# Patient Record
Sex: Female | Born: 1990 | Race: White | Hispanic: No | Marital: Single | Smoking: Former smoker
Health system: Southern US, Community
[De-identification: ages and names within clinical notes are randomized; demographics above are authoritative.]

## PROBLEM LIST (undated history)

## (undated) DIAGNOSIS — N390 Urinary tract infection, site not specified: Secondary | ICD-10-CM

## (undated) HISTORY — PX: NO PAST SURGERIES: SHX2092

---
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
## 2010-12-03 NOTE — L&D Delivery Note (Signed)
Delivery Note At  a viable female was delivered via NSVD (Presentation: LOA ;  ).  APGAR: 9, 9; weight 6lb 14oz.   Placenta status: intact, spontaneous.  Cord: 3 vessel with the following complications: none.    Anesthesia:  IV stadol as needed Episiotomy: none Lacerations: R periurethral Suture Repair: 3.0 vicryl Est. Blood Loss (mL):  Mom to postpartum.  Baby to nursery-stable.  BOOTH, Sadao Weyer 08/07/2011, 7:38 PM

## 2011-04-30 ENCOUNTER — Emergency Department (HOSPITAL_COMMUNITY)
Admission: EM | Admit: 2011-04-30 | Discharge: 2011-04-30 | Disposition: A | Discharge status: Home / Self Care | Payer: Medicaid Other | Attending: Emergency Medicine | Admitting: Emergency Medicine

## 2011-04-30 DIAGNOSIS — O2390 Unspecified genitourinary tract infection in pregnancy, unspecified trimester: Secondary | ICD-10-CM | POA: Insufficient documentation

## 2011-04-30 DIAGNOSIS — N390 Urinary tract infection, site not specified: Secondary | ICD-10-CM | POA: Insufficient documentation

## 2011-04-30 LAB — GLUCOSE, CAPILLARY: Glucose-Capillary: 96 mg/dL (ref 70–99)

## 2011-04-30 LAB — URINALYSIS, ROUTINE W REFLEX MICROSCOPIC
Glucose, UA: 100 mg/dL — AB
Nitrite: POSITIVE — AB
Specific Gravity, Urine: 1.025 (ref 1.005–1.030)

## 2011-04-30 LAB — URINE MICROSCOPIC-ADD ON

## 2011-06-28 LAB — STREP B DNA PROBE: GBS: NEGATIVE

## 2011-06-28 LAB — HEPATITIS B SURFACE ANTIGEN: Hepatitis B Surface Ag: NEGATIVE

## 2011-06-28 LAB — RUBELLA ANTIBODY, IGM: Rubella: IMMUNE

## 2011-06-28 LAB — ABO/RH: RH Type: POSITIVE

## 2011-08-07 ENCOUNTER — Encounter (HOSPITAL_COMMUNITY): Payer: Self-pay

## 2011-08-07 ENCOUNTER — Encounter (HOSPITAL_COMMUNITY): Payer: Self-pay | Admitting: *Deleted

## 2011-08-07 ENCOUNTER — Inpatient Hospital Stay (HOSPITAL_COMMUNITY)
Admission: AD | Admit: 2011-08-07 | Discharge: 2011-08-09 | DRG: 775 | Disposition: A | Discharge status: Home / Self Care | Payer: Medicaid Other | Source: Ambulatory Visit | Attending: Obstetrics & Gynecology | Admitting: Obstetrics & Gynecology

## 2011-08-07 ENCOUNTER — Inpatient Hospital Stay (HOSPITAL_COMMUNITY)
Admission: AD | Admit: 2011-08-07 | Discharge: 2011-08-07 | Disposition: A | Discharge status: Home / Self Care | Payer: Medicaid Other | Source: Ambulatory Visit | Attending: Obstetrics & Gynecology | Admitting: Obstetrics & Gynecology

## 2011-08-07 DIAGNOSIS — O479 False labor, unspecified: Secondary | ICD-10-CM | POA: Insufficient documentation

## 2011-08-07 DIAGNOSIS — IMO0001 Reserved for inherently not codable concepts without codable children: Secondary | ICD-10-CM

## 2011-08-07 LAB — CBC
HCT: 34.4 % — ABNORMAL LOW (ref 36.0–46.0)
Hemoglobin: 11.5 g/dL — ABNORMAL LOW (ref 12.0–15.0)
MCH: 28.3 pg (ref 26.0–34.0)
MCV: 84.5 fL (ref 78.0–100.0)
RBC: 4.07 MIL/uL (ref 3.87–5.11)

## 2011-08-07 LAB — RPR: RPR Ser Ql: NONREACTIVE

## 2011-08-07 MED ORDER — ONDANSETRON HCL 4 MG/2ML IJ SOLN: 4.0000 mg | INTRAMUSCULAR | Status: DC | PRN

## 2011-08-07 MED ORDER — ZOLPIDEM TARTRATE 5 MG PO TABS: 5.0000 mg | ORAL_TABLET | Freq: Every evening | ORAL | Status: DC | PRN

## 2011-08-07 MED ORDER — OXYTOCIN 20 UNITS IN LACTATED RINGERS INFUSION - SIMPLE
125.0000 mL/h | Freq: Once | INTRAVENOUS | Status: AC
  Administered 2011-08-07: 125 mL/h via INTRAVENOUS

## 2011-08-07 MED ORDER — DIBUCAINE 1 % RE OINT: 1.0000 "application " | TOPICAL_OINTMENT | RECTAL | Status: DC | PRN

## 2011-08-07 MED ORDER — FLEET ENEMA 7-19 GM/118ML RE ENEM: 1.0000 | ENEMA | RECTAL | Status: DC | PRN

## 2011-08-07 MED ORDER — TETANUS-DIPHTH-ACELL PERTUSSIS 5-2.5-18.5 LF-MCG/0.5 IM SUSP
0.5000 mL | Freq: Once | INTRAMUSCULAR | Status: AC
  Administered 2011-08-08: 0.5 mL via INTRAMUSCULAR
  Filled 2011-08-07: qty 0.5

## 2011-08-07 MED ORDER — BUTORPHANOL TARTRATE 2 MG/ML IJ SOLN
1.0000 mg | INTRAMUSCULAR | Status: DC | PRN
  Administered 2011-08-07 (×3): 1 mg via INTRAVENOUS
  Filled 2011-08-07 (×2): qty 1

## 2011-08-07 MED ORDER — OXYCODONE-ACETAMINOPHEN 5-325 MG PO TABS
1.0000 | ORAL_TABLET | ORAL | Status: DC | PRN
  Administered 2011-08-08: 2 via ORAL
  Administered 2011-08-08: 1 via ORAL
  Administered 2011-08-09: 2 via ORAL
  Filled 2011-08-07: qty 1

## 2011-08-07 MED ORDER — LACTATED RINGERS IV SOLN: 500.0000 mL | INTRAVENOUS | Status: DC | PRN

## 2011-08-07 MED ORDER — ONDANSETRON HCL 4 MG PO TABS: 4.0000 mg | ORAL_TABLET | ORAL | Status: DC | PRN

## 2011-08-07 MED ORDER — PRENATAL PLUS 27-1 MG PO TABS
1.0000 | ORAL_TABLET | Freq: Every day | ORAL | Status: DC
  Administered 2011-08-08 – 2011-08-09 (×2): 1 via ORAL
  Filled 2011-08-07 (×2): qty 1

## 2011-08-07 MED ORDER — CITRIC ACID-SODIUM CITRATE 334-500 MG/5ML PO SOLN: 30.0000 mL | ORAL | Status: DC | PRN

## 2011-08-07 MED ORDER — LIDOCAINE HCL (PF) 1 % IJ SOLN
30.0000 mL | INTRAMUSCULAR | Status: DC | PRN
  Filled 2011-08-07 (×2): qty 30

## 2011-08-07 MED ORDER — OXYTOCIN BOLUS FROM INFUSION
500.0000 mL | Freq: Once | INTRAVENOUS | Status: DC
  Filled 2011-08-07: qty 500
  Filled 2011-08-07: qty 1000

## 2011-08-07 MED ORDER — DIPHENHYDRAMINE HCL 25 MG PO CAPS
25.0000 mg | ORAL_CAPSULE | Freq: Four times a day (QID) | ORAL | Status: DC | PRN
Start: 2011-08-07 — End: 2011-08-09

## 2011-08-07 MED ORDER — IBUPROFEN 600 MG PO TABS
600.0000 mg | ORAL_TABLET | Freq: Four times a day (QID) | ORAL | Status: DC | PRN
  Filled 2011-08-07 (×5): qty 1

## 2011-08-07 MED ORDER — BENZOCAINE-MENTHOL 20-0.5 % EX AERO
1.0000 "application " | INHALATION_SPRAY | CUTANEOUS | Status: DC | PRN
  Administered 2011-08-08: 1 via TOPICAL

## 2011-08-07 MED ORDER — WITCH HAZEL-GLYCERIN EX PADS: 1.0000 "application " | MEDICATED_PAD | CUTANEOUS | Status: DC | PRN

## 2011-08-07 MED ORDER — ACETAMINOPHEN 325 MG PO TABS: 650.0000 mg | ORAL_TABLET | ORAL | Status: DC | PRN

## 2011-08-07 MED ORDER — BUTORPHANOL TARTRATE 2 MG/ML IJ SOLN
INTRAMUSCULAR | Status: AC
  Administered 2011-08-07: 1 mg via INTRAVENOUS
  Filled 2011-08-07: qty 1

## 2011-08-07 MED ORDER — LANOLIN HYDROUS EX OINT: TOPICAL_OINTMENT | CUTANEOUS | Status: DC | PRN

## 2011-08-07 MED ORDER — SENNOSIDES-DOCUSATE SODIUM 8.6-50 MG PO TABS
2.0000 | ORAL_TABLET | Freq: Every day | ORAL | Status: DC
  Administered 2011-08-08 (×2): 2 via ORAL

## 2011-08-07 MED ORDER — LACTATED RINGERS IV SOLN
INTRAVENOUS | Status: DC
  Administered 2011-08-07: 14:00:00 via INTRAVENOUS

## 2011-08-07 MED ORDER — OXYCODONE-ACETAMINOPHEN 5-325 MG PO TABS
2.0000 | ORAL_TABLET | ORAL | Status: DC | PRN
  Filled 2011-08-07 (×2): qty 1
  Filled 2011-08-07: qty 2

## 2011-08-07 MED ORDER — SIMETHICONE 80 MG PO CHEW: 80.0000 mg | CHEWABLE_TABLET | ORAL | Status: DC | PRN

## 2011-08-07 MED ORDER — ONDANSETRON HCL 4 MG/2ML IJ SOLN: 4.0000 mg | Freq: Four times a day (QID) | INTRAMUSCULAR | Status: DC | PRN

## 2011-08-07 MED ORDER — IBUPROFEN 600 MG PO TABS
600.0000 mg | ORAL_TABLET | Freq: Four times a day (QID) | ORAL | Status: DC
  Administered 2011-08-08 – 2011-08-09 (×6): 600 mg via ORAL
  Filled 2011-08-07: qty 1

## 2011-08-07 NOTE — ED Notes (Signed)
Discussed D/C instrs. at length with husb. and family members present. Acknowledged patients discomfort, irreg. contractions and UI. Discussed significance of cervical change. Emotional support to patient. Discussed comfort measures. Advised drinking fluids and eating small amounts. Advised that she take a warm shower or bath and try to rest. Instructed patient to call [facility] later if she continues to be uncomfortable and they may examine her in the office. Acknowledged family concerns. They want patient admitted now despite no cervical change. Explained to family the process of labor, that patient and baby are doing well and that there is no reason to interfere at this time. Family verbalizes understanding. EFM D/C'd.

## 2011-08-07 NOTE — Progress Notes (Signed)
  Mindy [name] is a 20 y.o. G1P0 at [redacted]w[redacted]d admitted for active labor.  Seen at 1630.  Subjective: Doing well with stadol for pain control.  Does not want epidural at this time.  Objective: BP 123/88  Pulse 63  Temp(Src) 98.4 F (36.9 C) (Oral)  Resp 20  Ht 5\' 5"  (1.651 m)  Wt 159 lb (72.122 kg)  BMI 26.46 kg/m2      FHT:  FHR: 110 bpm, variability: moderate,  accelerations:  Abscent,  decelerations:  Absent UC:   regular, every 2 minutes SVE:   Dilation: 7.5 Effacement (%): 90 Station: 0 Exam by:: Boothe  Labs: Lab Results  Component Value Date   WBC 20.8* 08/07/2011   HGB 11.5* 08/07/2011   HCT 34.4* 08/07/2011   MCV 84.5 08/07/2011   PLT 294 08/07/2011    Assessment / Plan: Spontaneous labor, progressing normally  Labor: Progressing normally Fetal Wellbeing:  Category I Pain Control:  stadol as needed I/D:  n/a Anticipated MOD:  NSVD  BOOTH, Kenlyn Lose 08/07/2011, 5:20 PM

## 2011-08-07 NOTE — H&P (Signed)
Mindy [name] is a 20 y.o. female presenting for contractions. Was seen earlier AM in MAU was 1 cm; was seen at [facility] for routine appt and had membranes stripped around 10 AM and contractions became stronger. Some bloody show, good fetal movement. Maternal Medical History:  Reason for admission: Reason for admission: contractions and vaginal bleeding.  Contractions: Onset was 3-5 hours ago.   Frequency: regular.   Duration is approximately 90 seconds.   Perceived severity is moderate.    Fetal activity: Perceived fetal activity is normal.   Last perceived fetal movement was within the past hour.    Prenatal complications: No bleeding.   Prenatal Complications - Diabetes: none.    OB History    Grav Para Term Preterm Abortions TAB SAB Ect Mult Living   1              Past Medical History  Diagnosis Date  . No pertinent past medical history    Past Surgical History  Procedure Date  . No past surgeries    Family History: family history is not on file. Social History:  reports that she has quit smoking. She does not have any smokeless tobacco history on file. She reports that she does not drink alcohol or use illicit drugs.  Review of Systems  Constitutional: Negative.   HENT: Negative.   Respiratory: Negative.   Cardiovascular: Negative.   Gastrointestinal: Negative.   Genitourinary: Negative.   Musculoskeletal: Negative.   Skin: Negative.   Neurological: Negative.   Endo/Heme/Allergies: Negative.   Psychiatric/Behavioral: Negative.     Dilation: 3 Effacement (%): 80 Station: -1 Exam by:: J BURNETTE RN  Blood pressure 130/84, pulse 76, temperature 98.6 F (37 C), temperature source Oral, resp. rate 20, height 5\' 5"  (1.651 m), weight 72.122 kg (159 lb). Maternal Exam:  Uterine Assessment: Contraction strength is moderate.  Contraction frequency is regular.   Abdomen: Estimated fetal weight is 7.5 - 8 pounds by leopolds.   Fetal presentation:  vertex  Introitus: Ferning test: not done.  Nitrazine test: not done.  Cervix: Cervix evaluated by digital exam.     Physical Exam  Vitals reviewed. Constitutional: She is oriented to person, place, and time. She appears well-developed and well-nourished. She appears distressed.  HENT:  Head: Normocephalic.  Eyes: Pupils are equal, round, and reactive to light.  Neck: Normal range of motion.  Cardiovascular: Normal rate, regular rhythm, normal heart sounds and intact distal pulses.  Exam reveals no gallop and no friction rub.   No murmur heard. Respiratory: Effort normal and breath sounds normal. No respiratory distress. She has no wheezes. She has no rales. She exhibits no tenderness.  GI: Soft. Bowel sounds are normal. There is no tenderness.  Musculoskeletal: Normal range of motion.  Neurological: She is alert and oriented to person, place, and time. She has normal reflexes. She displays normal reflexes. No cranial nerve deficit. She exhibits normal muscle tone. Coordination normal.  Skin: Skin is warm and dry. No rash noted. She is not diaphoretic.  Psychiatric: She has a normal mood and affect.  Dilation: 3 Effacement (%): 80 Station: -1 Presentation: Vertex Exam by:: J BURNETTE RN  FHT: 112-130's/+accels/no decels/contractions q2-3 min, Category 1    Prenatal labs: ABO, Rh:  A+ Antibody:  neg Rubella:  immune RPR:   NR HBsAg:   Neg HIV:   Neg GBS:   Neg 2 hr Glu: 79-112-91  Assessment/Plan: Active labor. Will admit for labor and delivery. Plans to breastfeed  and desires Implenon.  Seneca Gadbois N 08/07/2011, 1:51 PM

## 2011-08-07 NOTE — Progress Notes (Signed)
Contractions q 5 minutes, pinkish discharge on tissue, last SVE last week, 1cm/70

## 2011-08-08 MED ORDER — BENZOCAINE-MENTHOL 20-0.5 % EX AERO
INHALATION_SPRAY | CUTANEOUS | Status: AC
  Administered 2011-08-08: 1 via TOPICAL
  Filled 2011-08-08: qty 56

## 2011-08-08 NOTE — Progress Notes (Signed)
UR chart review completed.  

## 2011-08-08 NOTE — Progress Notes (Signed)
  Subjective:    Patient ID: Mindy [name], female    DOB: [birth year], 20 y.o.   MRN: [id number]  HPI Post Partum Day 1 Subjective: up ad lib, voiding, tolerating PO and moderate pain at bottom. Breast feeding is going well.   Objective: Blood pressure 102/55, pulse 77, temperature 99 F (37.2 C), temperature source Oral, resp. rate 20, height 5\' 5"  (1.651 m), weight 159 lb (72.122 kg), unknown if currently breastfeeding.  Physical Exam:  General: alert, cooperative and no distress Lochia: a bit more than a period. Uterine Fundus: firm Incision: N/A DVT Evaluation: No evidence of DVT seen on physical exam. No significant calf/ankle edema.   Basename 08/07/11 1420  HGB 11.5*  HCT 34.4*    Assessment/Plan: Plan for discharge tomorrow, Breastfeeding and Contraception Nexplanon at 6 week f/u.    LOS: 1 day   Mindy [name] 08/08/2011, 7:42 AM      Review of Systems     Objective:   Physical Exam        Assessment & Plan:

## 2011-08-09 MED ORDER — IBUPROFEN 600 MG PO TABS: 600.0000 mg | ORAL_TABLET | Freq: Four times a day (QID) | ORAL | Status: AC | PRN

## 2011-08-09 MED ORDER — PRENATAL PLUS 27-1 MG PO TABS: 1.0000 | ORAL_TABLET | Freq: Every day | ORAL | Status: DC

## 2011-08-09 MED ORDER — OXYCODONE-ACETAMINOPHEN 5-325 MG PO TABS: 1.0000 | ORAL_TABLET | ORAL | Status: AC | PRN

## 2011-08-09 NOTE — Discharge Summary (Signed)
Obstetric Discharge Summary Reason for Admission: onset of labor Prenatal Procedures: ultrasound Intrapartum Procedures: spontaneous vaginal delivery Postpartum Procedures: none Complications-Operative and Postpartum: periurethral laceration   H/H: Lab Results  Component Value Date/Time   HGB 11.5* 08/07/2011  2:20 PM   HCT 34.4* 08/07/2011  2:20 PM      Discharge Diagnoses: Term Pregnancy-delivered  Discharge Information: Date: 06/14/2011 Activity: unrestricted and pelvic rest Diet: routine Medications: PNV, Ibuprophen and Percocet Breast feeding:  Yes Condition: stable Instructions: refer to practice specific booklet Discharge to: home   [name], Mindy Grandmaison 08/09/2011,7:23 AM

## 2011-08-09 NOTE — Progress Notes (Signed)
  Post Partum Day 2 Subjective: no complaints, up ad lib, voiding, tolerating PO, + flatus and pain in her bottom., thin lochia, present BM, present flatus, plans to breastfeed, implanon  Objective: Blood pressure 95/61, pulse 73, temperature 98.1 F (36.7 C), temperature source Oral, resp. rate 18, height 5\' 5"  (1.651 m), weight 159 lb (72.122 kg), unknown if currently breastfeeding.  Physical Exam:  General: alert, cooperative, appears stated age and no distress Lochia: appropriate Chest: CTAB Heart: RRR no m/r/g Abdomen: +BS, soft, nontender,  Uterine Fundus: firm DVT Evaluation: No evidence of DVT seen on physical exam. Negative Homan's sign. No significant calf/ankle edema. Extremities:    Basename 08/07/11 1420  HGB 11.5*  HCT 34.4*    Assessment/Plan: Discharge home, Breastfeeding and Contraception implanon   LOS: 2 days   BOOTH, Bobak Oguinn 08/09/2011, 7:20 AM

## 2011-08-09 NOTE — Discharge Summary (Signed)
Agree with above note.  April Carlyon H. 08/09/2011 11:17 AM

## 2011-08-14 ENCOUNTER — Ambulatory Visit (HOSPITAL_COMMUNITY): Admit: 2011-08-14 | Payer: Medicaid Other

## 2011-09-06 LAB — URINALYSIS, ROUTINE W REFLEX MICROSCOPIC
Ketones, ur: NEGATIVE mg/dL
Nitrite: NEGATIVE
Protein, ur: NEGATIVE mg/dL
Urobilinogen, UA: 0.2 mg/dL (ref 0.0–1.0)
pH: 8 (ref 5.0–8.0)

## 2011-09-06 LAB — URINE CULTURE

## 2011-09-06 LAB — PREGNANCY, URINE: Preg Test, Ur: NEGATIVE

## 2012-01-17 ENCOUNTER — Emergency Department (HOSPITAL_COMMUNITY): Payer: Self-pay

## 2012-01-17 ENCOUNTER — Emergency Department (HOSPITAL_COMMUNITY)
Admission: EM | Admit: 2012-01-17 | Discharge: 2012-01-17 | Disposition: A | Discharge status: Home / Self Care | Payer: Self-pay | Attending: Emergency Medicine | Admitting: Emergency Medicine

## 2012-01-17 ENCOUNTER — Encounter (HOSPITAL_COMMUNITY): Payer: Self-pay | Admitting: Emergency Medicine

## 2012-01-17 DIAGNOSIS — R05 Cough: Secondary | ICD-10-CM

## 2012-01-17 DIAGNOSIS — J029 Acute pharyngitis, unspecified: Secondary | ICD-10-CM | POA: Insufficient documentation

## 2012-01-17 DIAGNOSIS — R059 Cough, unspecified: Secondary | ICD-10-CM | POA: Insufficient documentation

## 2012-01-17 DIAGNOSIS — J45909 Unspecified asthma, uncomplicated: Secondary | ICD-10-CM | POA: Insufficient documentation

## 2012-01-17 LAB — RAPID STREP SCREEN (MED CTR MEBANE ONLY): Streptococcus, Group A Screen (Direct): NEGATIVE

## 2012-01-17 MED ORDER — HYDROCOD POLST-CHLORPHEN POLST 10-8 MG/5ML PO LQCR
5.0000 mL | Freq: Once | ORAL | Status: AC
  Administered 2012-01-17: 5 mL via ORAL
  Filled 2012-01-17: qty 5

## 2012-01-17 MED ORDER — GUAIFENESIN-CODEINE 100-10 MG/5ML PO SYRP: ORAL_SOLUTION | ORAL | Status: DC

## 2012-01-17 NOTE — ED Provider Notes (Addendum)
History     CSN: [id number]  Arrival date & time 01/17/12  7253   First MD Initiated Contact with Patient 01/17/12 1017      Chief Complaint  Patient presents with  . Sore Throat  . Cough    (Consider location/radiation/quality/duration/timing/severity/associated sxs/prior treatment) HPI Comments: Pt has had NP cough and sore throat for 2 days.  The history is provided by the patient. No language interpreter was used.    Past Medical History  Diagnosis Date  . Asthma     exercise induced- no meds    Past Surgical History  Procedure Date  . No past surgeries     No family history on file.  History  Substance Use Topics  . Smoking status: Former Games developer  . Smokeless tobacco: Not on file  . Alcohol Use: No    OB History    Grav Para Term Preterm Abortions TAB SAB Ect Mult Living   1 1 1       1       Review of Systems  Constitutional: Negative for fever and chills.  Respiratory: Positive for cough. Negative for shortness of breath, wheezing and stridor.   All other systems reviewed and are negative.    Allergies  Review of patient's allergies indicates no known allergies.  Home Medications   Current Outpatient Rx  Name Route Sig Dispense Refill  . MENTHOL-ASCORBIC ACID 3-60 MG MT LOZG Mouth/Throat Use as directed 1 drop in the mouth or throat every 4 (four) hours as needed. For throat pain    . TYLENOL COLD/FLU SEVERE DAY PO Oral Take 15 mLs by mouth every 8 (eight) hours as needed. For cold symptoms    . ACETAMINOPHEN 500 MG PO TABS Oral Take 500 mg by mouth every 6 (six) hours as needed. For pain    . GUAIFENESIN-CODEINE 100-10 MG/5ML PO SYRP  Take 10 mls po q 4-6 hrs prn cough 240 mL 0    BP 111/92  Pulse 100  Temp(Src) 98.6 F (37 C) (Oral)  Resp 18  Ht 5\' 5"  (1.651 m)  Wt 130 lb (58.968 kg)  BMI 21.63 kg/m2  SpO2 100%  Physical Exam  Nursing note and vitals reviewed. Constitutional: She is oriented to person, place, and time. She appears  well-developed and well-nourished.  HENT:  Head: Normocephalic. No trismus in the jaw.  Right Ear: External ear normal.  Left Ear: External ear normal.  Nose: Nose normal.  Mouth/Throat: Uvula is midline and mucous membranes are normal. No uvula swelling. Oropharyngeal exudate and posterior oropharyngeal erythema present. No posterior oropharyngeal edema or tonsillar abscesses.  Eyes: EOM are normal.  Neck: Normal range of motion.  Pulmonary/Chest: Effort normal and breath sounds normal. No accessory muscle usage. Not tachypneic. No respiratory distress. She has no decreased breath sounds.  Musculoskeletal: Normal range of motion.  Lymphadenopathy:    She has no cervical adenopathy.  Neurological: She is alert and oriented to person, place, and time. Coordination normal.  Skin: Skin is warm and dry.  Psychiatric: She has a normal mood and affect. Her behavior is normal. Judgment and thought content normal.    ED Course  Procedures (including critical care time)   Labs Reviewed  RAPID STREP SCREEN   Dg Chest 2 View  01/17/2012  *RADIOLOGY REPORT*  Clinical Data: Fever and cough.  CHEST - 2 VIEW  Comparison: None.  Findings: Trachea is midline.  Heart size normal.  Lungs are clear. No pleural fluid.  Mild pectus  deformity.  IMPRESSION: No acute findings.  Original Report Authenticated By: Reyes Ivan, M.D.     1. Pharyngitis   2. Persistent cough       MDM          Worthy Rancher, PA 01/17/12 1325  Worthy Rancher, PA 02/12/12 1950

## 2012-01-17 NOTE — Discharge Instructions (Signed)
Pharyngitis, Viral and Bacterial Pharyngitis is soreness (inflammation) or infection of the pharynx. It is also called a sore throat. CAUSES  Most sore throats are caused by viruses and are part of a cold. However, some sore throats are caused by strep and other bacteria. Sore throats can also be caused by post nasal drip from draining sinuses, allergies and sometimes from sleeping with an open mouth. Infectious sore throats can be spread from person to person by coughing, sneezing and sharing cups or eating utensils. TREATMENT  Sore throats that are viral usually last 3-4 days. Viral illness will get better without medications (antibiotics). Strep throat and other bacterial infections will usually begin to get better about 24-48 hours after you begin to take antibiotics. HOME CARE INSTRUCTIONS   If the caregiver feels there is a bacterial infection or if there is a positive strep test, they will prescribe an antibiotic. The full course of antibiotics must be taken. If the full course of antibiotic is not taken, you or your child may become ill again. If you or your child has strep throat and do not finish all of the medication, serious heart or kidney diseases may develop.   Drink enough water and fluids to keep your urine clear or pale yellow.   Only take over-the-counter or prescription medicines for pain, discomfort or fever as directed by your caregiver.   Get lots of rest.   Gargle with salt water ( tsp. of salt in a glass of water) as often as every 1-2 hours as you need for comfort.   Hard candies may soothe the throat if individual is not at risk for choking. Throat sprays or lozenges may also be used.  SEEK MEDICAL CARE IF:   Large, tender lumps in the neck develop.   A rash develops.   Green, yellow-brown or bloody sputum is coughed up.   Your baby is older than 3 months with a rectal temperature of 100.5 F (38.1 C) or higher for more than 1 day.  SEEK IMMEDIATE MEDICAL CARE  IF:   A stiff neck develops.   You or your child are drooling or unable to swallow liquids.   You or your child are vomiting, unable to keep medications or liquids down.   You or your child has severe pain, unrelieved with recommended medications.   You or your child are having difficulty breathing (not due to stuffy nose).   You or your child are unable to fully open your mouth.   You or your child develop redness, swelling, or severe pain anywhere on the neck.   You have a fever.   Your baby is older than 3 months with a rectal temperature of 102 F (38.9 C) or higher.   Your baby is 25 months old or younger with a rectal temperature of 100.4 F (38 C) or higher.  MAKE SURE YOU:   Understand these instructions.   Will watch your condition.   Will get help right away if you are not doing well or get worse.  Document Released: 11/19/2005 Document Revised: 08/01/2011 Document Reviewed: 02/16/2008 [facility] Patient Information 2012 [place], [place].Cough, Adult  A cough is a reflex that helps clear your throat and airways. It can help heal the body or may be a reaction to an irritated airway. A cough may only last 2 or 3 weeks (acute) or may last more than 8 weeks (chronic).  CAUSES Acute cough:  Viral or bacterial infections.  Chronic cough:  Infections.  Allergies.   Asthma.   Post-nasal drip.   Smoking.   Heartburn or acid reflux.   Some medicines.   Chronic lung problems (COPD).   Cancer.  SYMPTOMS   Cough.   Fever.   Chest pain.   Increased breathing rate.   High-pitched whistling sound when breathing (wheezing).   Colored mucus that you cough up (sputum).  TREATMENT   A bacterial cough may be treated with antibiotic medicine.   A viral cough must run its course and will not respond to antibiotics.   Your caregiver may recommend other treatments if you have a chronic cough.  HOME CARE INSTRUCTIONS   Only take over-the-counter or  prescription medicines for pain, discomfort, or fever as directed by your caregiver. Use cough suppressants only as directed by your caregiver.   Use a cold steam vaporizer or humidifier in your bedroom or home to help loosen secretions.   Sleep in a semi-upright position if your cough is worse at night.   Rest as needed.   Stop smoking if you smoke.  SEEK IMMEDIATE MEDICAL CARE IF:   You have pus in your sputum.   Your cough starts to worsen.   You cannot control your cough with suppressants and are losing sleep.   You begin coughing up blood.   You have difficulty breathing.   You develop pain which is getting worse or is uncontrolled with medicine.   You have a fever.  MAKE SURE YOU:   Understand these instructions.   Will watch your condition.   Will get help right away if you are not doing well or get worse.  Document Released: 05/18/2011 Document Revised: 08/01/2011 Document Reviewed: 05/18/2011 [facility] Patient Information 2012 [place], [place].   Take the cough medicine as directed.  Gargle frequently with salt water..  Chloraseptic will help also.  The strep screen is negative and the chest x-rays show no signs of pneumonia.  Follow up with the MD of your choice.

## 2012-01-17 NOTE — ED Notes (Signed)
Patient c/o sore throat and cough x 2 days. Tonsils appear swollen and some white patches noted.

## 2012-01-17 NOTE — ED Provider Notes (Signed)
Medical screening examination/treatment/procedure(s) were performed by non-physician practitioner and as supervising physician I was immediately available for consultation/collaboration.   Dayton Bailiff, MD 01/17/12 [phone]

## 2012-01-17 NOTE — ED Notes (Signed)
Pt Dc to home with steady gait. 

## 2013-03-30 ENCOUNTER — Encounter: Payer: Self-pay | Admitting: *Deleted

## 2013-03-31 ENCOUNTER — Encounter: Payer: Self-pay | Admitting: Advanced Practice Midwife

## 2013-03-31 ENCOUNTER — Ambulatory Visit (INDEPENDENT_AMBULATORY_CARE_PROVIDER_SITE_OTHER): Payer: Medicaid Other | Admitting: Obstetrics & Gynecology

## 2013-03-31 VITALS — BP 110/60 | Wt 138.0 lb

## 2013-03-31 DIAGNOSIS — L708 Other acne: Secondary | ICD-10-CM

## 2013-03-31 DIAGNOSIS — L700 Acne vulgaris: Secondary | ICD-10-CM | POA: Insufficient documentation

## 2013-03-31 NOTE — Progress Notes (Signed)
Mindy [name] 22 y.o.  CHIEF COMPLAINT: Got Nexplanon 6 months ago.  Started getting acne 3 months ago.  Thought she was gaining wt, but has gained 4 lbs and "eats when bored"--no cravings and doesn't have any problems feeling full.    ROS: Review of Systems  Constitutional: Negative for fever, chills, weight loss, malaise/fatigue and diaphoresis.  HENT: Negative for hearing loss, ear pain, nosebleeds, congestion, sore throat, neck pain, tinnitus and ear discharge.   Eyes: Negative for blurred vision, double vision, photophobia, pain, discharge and redness.  Musculoskeletal: Negative for myalgias, back pain, joint pain and falls.  Skin: Negative for itching and rash. POSITIVE for acne on face, none on body Neurological: Negative for dizziness, tingling, tremors, sensory change, speech change, focal weakness, seizures, loss of consciousness, weakness and headaches.   PHYSICAL EXAM:  Acne in various stages on forehead, cheeks, and chin.  Some cystic lesions  ASSESSMENT:  Acne r/t Nexplanon PLAN: Options discussed.  Will try Retin-A 0.025%,(pea sized amt for whole face q3days X 1 week, q2 days X1 week, then qhs working up in strength), and Cleocin 1% topical sol PRN acne

## 2013-04-01 ENCOUNTER — Telehealth: Payer: Self-pay | Admitting: *Deleted

## 2013-04-01 MED ORDER — CLINDAMYCIN PHOSPHATE 1 % EX GEL: Freq: Two times a day (BID) | CUTANEOUS | Status: DC

## 2013-04-01 MED ORDER — TRETINOIN 0.025 % EX CREA: TOPICAL_CREAM | Freq: Every day | CUTANEOUS | Status: DC

## 2013-04-01 NOTE — Telephone Encounter (Signed)
Pt informed RX e-scribed this am

## 2013-04-01 NOTE — Telephone Encounter (Signed)
Retin a and cleocin topical for acne

## 2013-06-18 ENCOUNTER — Telehealth: Payer: Self-pay | Admitting: *Deleted

## 2013-06-18 ENCOUNTER — Encounter (HOSPITAL_COMMUNITY): Payer: Self-pay | Admitting: *Deleted

## 2013-06-18 ENCOUNTER — Emergency Department (HOSPITAL_COMMUNITY)
Admission: EM | Admit: 2013-06-18 | Discharge: 2013-06-18 | Disposition: A | Discharge status: Home / Self Care | Payer: Medicaid Other | Attending: Emergency Medicine | Admitting: Emergency Medicine

## 2013-06-18 DIAGNOSIS — N390 Urinary tract infection, site not specified: Secondary | ICD-10-CM

## 2013-06-18 DIAGNOSIS — J45909 Unspecified asthma, uncomplicated: Secondary | ICD-10-CM | POA: Insufficient documentation

## 2013-06-18 DIAGNOSIS — R350 Frequency of micturition: Secondary | ICD-10-CM | POA: Insufficient documentation

## 2013-06-18 DIAGNOSIS — Z87891 Personal history of nicotine dependence: Secondary | ICD-10-CM | POA: Insufficient documentation

## 2013-06-18 HISTORY — DX: Urinary tract infection, site not specified: N39.0

## 2013-06-18 LAB — URINALYSIS, ROUTINE W REFLEX MICROSCOPIC
Glucose, UA: NEGATIVE mg/dL
Nitrite: NEGATIVE
Protein, ur: 100 mg/dL — AB
pH: 6 (ref 5.0–8.0)

## 2013-06-18 LAB — PREGNANCY, URINE: Preg Test, Ur: NEGATIVE

## 2013-06-18 LAB — URINE MICROSCOPIC-ADD ON

## 2013-06-18 MED ORDER — CEPHALEXIN 500 MG PO CAPS: 500.0000 mg | ORAL_CAPSULE | Freq: Four times a day (QID) | ORAL | Status: DC

## 2013-06-18 MED ORDER — NAPROXEN 250 MG PO TABS
ORAL_TABLET | ORAL | Status: AC
  Administered 2013-06-18: 500 mg
  Filled 2013-06-18: qty 2

## 2013-06-18 MED ORDER — CEPHALEXIN 500 MG PO CAPS
500.0000 mg | ORAL_CAPSULE | Freq: Once | ORAL | Status: AC
  Administered 2013-06-18: 500 mg via ORAL
  Filled 2013-06-18: qty 1

## 2013-06-18 NOTE — ED Notes (Signed)
The patient appears in no acute distress at present, states that the pain in her right flank is similar to her past UTI's.

## 2013-06-18 NOTE — ED Notes (Signed)
Patient w/history of frequent UTIs began having urinary frequency yesterday, flank pain began about 0300 this a.m.  Now radiating into lower abdomen. No dysuria but pressure.

## 2013-06-18 NOTE — ED Notes (Signed)
Called to bedside, pt states she is in a lot of pain, rates her pain 6/10.

## 2013-06-18 NOTE — ED Provider Notes (Signed)
History    This chart was scribed for Mindy Roller, MD by Quintella Reichert, ED scribe.  This patient was seen in room APA03/AP[facility]03 and the patient's care was started at 5:34 PM.   CSN: [id number]  Arrival date & time 06/18/13  1720     Chief Complaint  Patient presents with  . Flank Pain  . Abdominal Pain    The history is provided by the patient. No language interpreter was used.     HPI Comments: Mindy [name] is a 22 y.o. female with h/o frequent UTIs who presents to the Emergency Department complaining of urinary frequency that began yesterday, with accompanying constant, moderate, right flank pain that began today.  Pain is localized to the right CVA and radiates to the right lower abdomen.  Pt also states that she has a feeling of pressure like she needs to urinate.  She denies nausea, emesis, fever, chils, diarrhea or any other associated symptoms.  Pt states that she gets UTIs 2-3 times per year.  She has not consulted with a urologist.  Her LNMP ended yesterday and was normal.  She denies h/o DM.   She denies h/o abdominal surgical history.    Past Medical History  Diagnosis Date  . Asthma     exercise induced- no meds  . UTI (lower urinary tract infection)     Past Surgical History  Procedure Laterality Date  . No past surgeries      Family History  Problem Relation Age of Onset  . Cancer Maternal Aunt     lung cancer  . Heart disease Other     CAD  . Diabetes Other     History  Substance Use Topics  . Smoking status: Former Games developer  . Smokeless tobacco: Not on file  . Alcohol Use: No    OB History   Grav Para Term Preterm Abortions TAB SAB Ect Mult Living   1 1 1       1       Review of Systems A complete 10 system review of systems was obtained and all systems are negative except as noted in the HPI and PMH.    Allergies  Review of patient's allergies indicates no known allergies.  Home Medications   Current Outpatient Rx  Name  Route   Sig  Dispense  Refill  . Aspirin-Salicylamide-Caffeine (BC HEADACHE) 325-95-16 MG TABS   Oral   Take 1 packet by mouth daily as needed (for pain).         . clindamycin (CLINDAMAX) 1 % gel   Topical   Apply topically 2 (two) times daily.   30 g   0   . ibuprofen (ADVIL,MOTRIN) 200 MG tablet   Oral   Take 400 mg by mouth every 6 (six) hours as needed for pain.         Marland Kitchen tretinoin (RETIN-A) 0.025 % cream   Topical   Apply topically at bedtime.   45 g   0   . cephALEXin (KEFLEX) 500 MG capsule   Oral   Take 1 capsule (500 mg total) by mouth 4 (four) times daily.   20 capsule   0    BP 130/85  Pulse 93  Temp(Src) 99.1 F (37.3 C) (Oral)  Resp 20  Ht 5\' 5"  (1.651 m)  Wt 136 lb (61.689 kg)  BMI 22.63 kg/m2  SpO2 100%  LMP 06/15/2013  Breastfeeding? No  Physical Exam  Nursing note and vitals reviewed. Constitutional:  She appears well-developed and well-nourished. No distress.  Well-appearing.  HENT:  Head: Normocephalic and atraumatic.  Mouth/Throat: Oropharynx is clear and moist. No oropharyngeal exudate.  Eyes: Conjunctivae and EOM are normal. Pupils are equal, round, and reactive to light. Right eye exhibits no discharge. Left eye exhibits no discharge. No scleral icterus.  Neck: Normal range of motion. Neck supple. No JVD present. No thyromegaly present.  Cardiovascular: Normal rate, regular rhythm, normal heart sounds and intact distal pulses.  Exam reveals no gallop and no friction rub.   No murmur heard. Pulmonary/Chest: Effort normal and breath sounds normal. No respiratory distress. She has no wheezes. She has no rales.  Abdominal: Soft. Bowel sounds are normal. She exhibits no distension and no mass. There is tenderness.  Minimal suprapubic tenderness  Genitourinary:  Mild right CVA tenderness  Musculoskeletal: Normal range of motion. She exhibits no edema and no tenderness.  Lymphadenopathy:    She has no cervical adenopathy.  Neurological: She is  alert. Coordination normal.  Skin: Skin is warm and dry. No rash noted. No erythema.  Psychiatric: She has a normal mood and affect. Her behavior is normal.    ED Course  Procedures (including critical care time)   DIAGNOSTIC STUDIES: Oxygen Saturation is 100% on room air, normal by my interpretation.    COORDINATION OF CARE: 5:39 PM-Discussed treatment plan which includes UA with pt at bedside and pt agreed to plan.     Labs Reviewed  URINALYSIS, ROUTINE W REFLEX MICROSCOPIC - Abnormal; Notable for the following:    APPearance HAZY (*)    Hgb urine dipstick LARGE (*)    Protein, ur 100 (*)    Leukocytes, UA SMALL (*)    All other components within normal limits  URINE MICROSCOPIC-ADD ON - Abnormal; Notable for the following:    Squamous Epithelial / LPF MANY (*)    Bacteria, UA FEW (*)    All other components within normal limits  URINE CULTURE  PREGNANCY, URINE   No results found.  1. UTI (lower urinary tract infection)     MDM  Overall the patient is well appearing with normal vital signs and minimal tenderness on exam. Her urinalysis is consistent with a urinary tract infection. I have already described to the patient she should not be having unprotected sex until she is off antibiotics, I have recommended 2 weeks. She has agreed, has expressed her understanding to the indications for followup, I have also recommended urology followup due to recurrent urinary infections.  Meds given in ED:  Medications  cephALEXin (KEFLEX) capsule 500 mg (not administered)    New Prescriptions   CEPHALEXIN (KEFLEX) 500 MG CAPSULE    Take 1 capsule (500 mg total) by mouth 4 (four) times daily.        I personally performed the services described in this documentation, which was scribed in my presence. The recorded information has been reviewed and is accurate.      Mindy Roller, MD 06/18/13 [id number]

## 2013-06-20 LAB — URINE CULTURE: Colony Count: >=100000

## 2013-06-21 ENCOUNTER — Telehealth (HOSPITAL_COMMUNITY): Payer: Self-pay | Admitting: Emergency Medicine

## 2013-06-21 NOTE — Progress Notes (Signed)
ED Antimicrobial Stewardship Positive Culture Follow Up   Mindy [name] is an 22 y.o. female who presented to [facility] on 06/18/2013 with a chief complaint of urinary frequency and flank pain  Chief Complaint  Patient presents with  . Flank Pain  . Abdominal Pain    Recent Results (from the past 720 hour(s))  URINE CULTURE     Status: None   Collection Time    06/18/13  5:49 PM      Result Value Range Status   Specimen Description URINE, CLEAN CATCH   Final   Special Requests NONE   Final   Culture  Setup Time 06/18/2013 18:55   Final   Colony Count >=100,000 COLONIES/ML   Final   Culture ENTEROBACTER AEROGENES   Final   Report Status 06/20/2013 FINAL   Final   Organism ID, Bacteria ENTEROBACTER AEROGENES   Final    [x]  Treated with Kefelx, organism resistant to prescribed antimicrobial  New antibiotic prescription: D/c Keflex and start Bactrim DS 1 tab bid x 7 days  ED Provider: Emilia Beck, PA-C  [name] 06/21/2013, 10:02 AM Infectious Diseases Pharmacist Phone# [phone]

## 2013-06-21 NOTE — ED Notes (Signed)
Post ED Visit - Positive Culture Follow-up: Successful Patient Follow-Up  Culture assessed and recommendations reviewed by: []  Wes Dulaney, Pharm.D., BCPS []  Celedonio Miyamoto, Pharm.D., BCPS [x]  Georgina Pillion, Pharm.D., BCPS []  [place], [address].D., BCPS, AAHIVP []  Estella Husk, Pharm.D., BCPS, AAHIVP  Positive urine culture  []  Patient discharged without antimicrobial prescription and treatment is now indicated [x]  Organism is resistant to prescribed ED discharge antimicrobial []  Patient with positive blood cultures  Changes discussed with ED provider: Emilia Beck PA-C New antibiotic prescription: Bactrim DS 1 tab BID x 7 days    Kylie A Holland 06/21/2013, 11:00 AM

## 2013-06-23 NOTE — Telephone Encounter (Signed)
Pt states had went to ER and given Keflex for UTI, feeling better now.

## 2013-06-27 ENCOUNTER — Telehealth (HOSPITAL_COMMUNITY): Payer: Self-pay | Admitting: Emergency Medicine

## 2013-06-28 ENCOUNTER — Telehealth (HOSPITAL_COMMUNITY): Payer: Self-pay | Admitting: Emergency Medicine

## 2013-06-28 NOTE — ED Notes (Signed)
Unable to contact patient via phone. Sent letter. °

## 2013-07-07 ENCOUNTER — Telehealth: Payer: Self-pay | Admitting: Advanced Practice Midwife

## 2013-07-07 NOTE — Telephone Encounter (Signed)
Spoke with pt. Mindy [name] started pt on meds for face and was told dose could be increased. She is on Tretinoin cream 0.025% and Clindamycin 1% gel. Can you order an increased dose on both? Uses [facility] in [place].

## 2013-07-08 MED ORDER — CLINDAMYCIN-TRETINOIN 1.2-0.025 % EX GEL: Freq: Every day | CUTANEOUS | Status: DC

## 2013-07-08 NOTE — Telephone Encounter (Signed)
Spoke with pt. Dr. Despina Hidden prescribed the combined Clinda Trentoin gel, and e prescribed that to [facility] in [place]. Pt aware. JSY

## 2013-07-13 ENCOUNTER — Other Ambulatory Visit: Payer: Self-pay | Admitting: *Deleted

## 2013-07-14 MED ORDER — CLINDAMYCIN-TRETINOIN 1.2-0.025 % EX GEL: Freq: Every day | CUTANEOUS | Status: DC

## 2013-07-22 ENCOUNTER — Other Ambulatory Visit: Payer: Self-pay | Admitting: *Deleted

## 2013-07-23 ENCOUNTER — Other Ambulatory Visit: Payer: Self-pay | Admitting: *Deleted

## 2013-07-23 MED ORDER — CLINDAMYCIN-TRETINOIN 1.2-0.025 % EX GEL: Freq: Every day | CUTANEOUS | Status: DC

## 2013-07-24 ENCOUNTER — Other Ambulatory Visit: Payer: Self-pay | Admitting: Obstetrics & Gynecology

## 2013-07-24 ENCOUNTER — Telehealth: Payer: Self-pay | Admitting: *Deleted

## 2013-07-24 MED ORDER — CLINDAMYCIN PHOSPHATE 1 % EX GEL: Freq: Two times a day (BID) | CUTANEOUS | Status: DC

## 2013-07-24 MED ORDER — TRETINOIN 0.1 % EX CREA: TOPICAL_CREAM | Freq: Every day | CUTANEOUS | Status: DC

## 2013-07-24 NOTE — Telephone Encounter (Signed)
Pt informed that Retin-A had been increased to 0.1% from 0.025%. Clindamycin dose will remain the same. Pt voiced understanding. JSY

## 2013-07-24 NOTE — Telephone Encounter (Signed)
Call patient and tell her the other med is not covered by her insurance.  So I increased the Retin A to 0.1% from 0.025%, quite a bit stronger.  There is not a higher dose of the clindamycin topical, so that will remain the same

## 2013-08-05 NOTE — Telephone Encounter (Signed)
Dr Despina Hidden aware

## 2013-08-20 ENCOUNTER — Other Ambulatory Visit: Payer: Self-pay | Admitting: *Deleted

## 2013-08-20 NOTE — Telephone Encounter (Signed)
RX needs prior authorization for clindamycin 1% gel.

## 2014-03-31 ENCOUNTER — Encounter: Payer: Self-pay | Admitting: Advanced Practice Midwife

## 2014-03-31 ENCOUNTER — Ambulatory Visit (INDEPENDENT_AMBULATORY_CARE_PROVIDER_SITE_OTHER): Payer: Medicaid Other | Admitting: Advanced Practice Midwife

## 2014-03-31 VITALS — BP 118/60 | Ht 64.0 in | Wt 140.0 lb

## 2014-03-31 DIAGNOSIS — N760 Acute vaginitis: Secondary | ICD-10-CM

## 2014-03-31 DIAGNOSIS — B9689 Other specified bacterial agents as the cause of diseases classified elsewhere: Secondary | ICD-10-CM

## 2014-03-31 DIAGNOSIS — A499 Bacterial infection, unspecified: Secondary | ICD-10-CM

## 2014-03-31 DIAGNOSIS — Z3202 Encounter for pregnancy test, result negative: Secondary | ICD-10-CM

## 2014-03-31 LAB — POCT URINE PREGNANCY: Preg Test, Ur: NEGATIVE

## 2014-03-31 NOTE — Progress Notes (Signed)
CHIEF COMPLAINT/HPI:  23 y.o. female complains of white and copious vaginal discharge for a week .  C/O fishy odor after sex.  Is very sensitive to products.  Feels like she gets irritation if she has sex too often. Denies abnormal vaginal bleeding, significant pelvic pain or fever. No UTI symptoms. Sexually active, does not use condoms, no change in partner.  Retin A only made her skin dry after 6 months.  Now uses OTC product that works well.  Requests UPT (boyfriend does) but has Nexplanon  Past Medical History: Past Medical History  Diagnosis Date  . Asthma     exercise induced- no meds  . UTI (lower urinary tract infection)     Past Surgical History: Past Surgical History  Procedure Laterality Date  . No past surgeries      Obstetrical History: OB History   Grav Para Term Preterm Abortions TAB SAB Ect Mult Living   1 1 1       1         Social History: History   Social History  . Marital Status: Single    Spouse Name: N/A    Number of Children: N/A  . Years of Education: N/A   Social History Main Topics  . Smoking status: Former Research scientist (life sciences)  . Smokeless tobacco: None  . Alcohol Use: No  . Drug Use: No  . Sexual Activity: Yes   Other Topics Concern  . None   Social History Narrative  . None    Family History: Family History  Problem Relation Age of Onset  . Cancer Maternal Aunt     lung cancer  . Heart disease Other     CAD  . Diabetes Other     Allergies: No Known Allergies      PHYSICAL EXAM: Pelvic - Thin white discharge iwt amine odor.  Wet prep + clue.   Labs: Results for orders placed in visit on 03/31/14 (from the past 24 hour(s))  POCT URINE PREGNANCY   Collection Time    03/31/14 11:23 AM      Result Value Ref Range   Preg Test, Ur Negative       Assessment: Patient Active Problem List   Diagnosis Date Noted  . Acne vulgaris 03/31/2013    Plan:  Orders Placed This Encounter  Procedures  . POCT urine pregnancy    Tindamax samples RePhresh samples  Christin Fudge 03/31/2014 11:49 AM

## 2014-06-21 ENCOUNTER — Encounter (HOSPITAL_COMMUNITY): Payer: Self-pay | Admitting: Emergency Medicine

## 2014-06-21 ENCOUNTER — Emergency Department (HOSPITAL_COMMUNITY)
Admission: EM | Admit: 2014-06-21 | Discharge: 2014-06-21 | Disposition: A | Discharge status: Home / Self Care | Payer: Medicaid Other | Attending: Emergency Medicine | Admitting: Emergency Medicine

## 2014-06-21 DIAGNOSIS — Z87440 Personal history of urinary (tract) infections: Secondary | ICD-10-CM | POA: Insufficient documentation

## 2014-06-21 DIAGNOSIS — Z3202 Encounter for pregnancy test, result negative: Secondary | ICD-10-CM | POA: Insufficient documentation

## 2014-06-21 DIAGNOSIS — N10 Acute tubulo-interstitial nephritis: Secondary | ICD-10-CM

## 2014-06-21 DIAGNOSIS — Z79899 Other long term (current) drug therapy: Secondary | ICD-10-CM | POA: Insufficient documentation

## 2014-06-21 DIAGNOSIS — Z87891 Personal history of nicotine dependence: Secondary | ICD-10-CM | POA: Insufficient documentation

## 2014-06-21 DIAGNOSIS — J45909 Unspecified asthma, uncomplicated: Secondary | ICD-10-CM | POA: Insufficient documentation

## 2014-06-21 DIAGNOSIS — H109 Unspecified conjunctivitis: Secondary | ICD-10-CM | POA: Insufficient documentation

## 2014-06-21 LAB — BASIC METABOLIC PANEL
ANION GAP: 14 (ref 5–15)
BUN: 12 mg/dL (ref 6–23)
CALCIUM: 9.6 mg/dL (ref 8.4–10.5)
CO2: 25 mEq/L (ref 19–32)
CREATININE: 0.8 mg/dL (ref 0.50–1.10)
Chloride: 95 mEq/L — ABNORMAL LOW (ref 96–112)
GFR calc Af Amer: >90 mL/min (ref >90)
Glucose, Bld: 160 mg/dL — ABNORMAL HIGH (ref 70–99)
Potassium: 3.5 mEq/L — ABNORMAL LOW (ref 3.7–5.3)
SODIUM: 134 meq/L — AB (ref 137–147)

## 2014-06-21 LAB — CBC WITH DIFFERENTIAL/PLATELET
BASOS ABS: 0 10*3/uL (ref 0.0–0.1)
BASOS PCT: 0 % (ref 0–1)
EOS ABS: 0 10*3/uL (ref 0.0–0.7)
EOS PCT: 0 % (ref 0–5)
HCT: 41.4 % (ref 36.0–46.0)
Hemoglobin: 14 g/dL (ref 12.0–15.0)
Lymphocytes Relative: 24 % (ref 12–46)
Lymphs Abs: 2.6 10*3/uL (ref 0.7–4.0)
MCH: 29.7 pg (ref 26.0–34.0)
MCHC: 33.8 g/dL (ref 30.0–36.0)
MCV: 87.7 fL (ref 78.0–100.0)
MONO ABS: 0.5 10*3/uL (ref 0.1–1.0)
Monocytes Relative: 5 % (ref 3–12)
Neutro Abs: 7.8 10*3/uL — ABNORMAL HIGH (ref 1.7–7.7)
Neutrophils Relative %: 71 % (ref 43–77)
PLATELETS: 243 10*3/uL (ref 150–400)
RBC: 4.72 MIL/uL (ref 3.87–5.11)
RDW: 13.3 % (ref 11.5–15.5)
WBC: 10.9 10*3/uL — AB (ref 4.0–10.5)

## 2014-06-21 LAB — PREGNANCY, URINE: PREG TEST UR: NEGATIVE

## 2014-06-21 LAB — URINALYSIS, ROUTINE W REFLEX MICROSCOPIC
Bilirubin Urine: NEGATIVE
Glucose, UA: NEGATIVE mg/dL
Ketones, ur: NEGATIVE mg/dL
NITRITE: NEGATIVE
Protein, ur: 30 mg/dL — AB
SPECIFIC GRAVITY, URINE: 1.025 (ref 1.005–1.030)
UROBILINOGEN UA: 0.2 mg/dL (ref 0.0–1.0)
pH: 5.5 (ref 5.0–8.0)

## 2014-06-21 LAB — URINE MICROSCOPIC-ADD ON

## 2014-06-21 MED ORDER — DEXTROSE 5 % IV SOLN
1.0000 g | Freq: Once | INTRAVENOUS | Status: AC
  Administered 2014-06-21: 1 g via INTRAVENOUS
  Filled 2014-06-21: qty 10

## 2014-06-21 MED ORDER — OXYCODONE-ACETAMINOPHEN 5-325 MG PO TABS: 1.0000 | ORAL_TABLET | ORAL | Status: DC | PRN

## 2014-06-21 MED ORDER — SULFACETAMIDE SODIUM 10 % OP SOLN: 2.0000 [drp] | OPHTHALMIC | Status: DC

## 2014-06-21 MED ORDER — MORPHINE SULFATE 4 MG/ML IJ SOLN
4.0000 mg | Freq: Once | INTRAMUSCULAR | Status: AC
  Administered 2014-06-21: 4 mg via INTRAVENOUS
  Filled 2014-06-21: qty 1

## 2014-06-21 MED ORDER — ONDANSETRON HCL 4 MG/2ML IJ SOLN
4.0000 mg | Freq: Once | INTRAMUSCULAR | Status: AC
  Administered 2014-06-21: 4 mg via INTRAMUSCULAR
  Filled 2014-06-21: qty 2

## 2014-06-21 MED ORDER — CEPHALEXIN 500 MG PO CAPS: 500.0000 mg | ORAL_CAPSULE | Freq: Four times a day (QID) | ORAL | Status: DC

## 2014-06-21 MED ORDER — ONDANSETRON HCL 4 MG PO TABS: 4.0000 mg | ORAL_TABLET | Freq: Four times a day (QID) | ORAL | Status: DC | PRN

## 2014-06-21 NOTE — Discharge Instructions (Signed)
Pyelonephritis, Adult Pyelonephritis is a kidney infection. In general, there are 2 main types of pyelonephritis:  Infections that come on quickly without any warning (acute pyelonephritis).  Infections that persist for a long period of time (chronic pyelonephritis). CAUSES  Two main causes of pyelonephritis are:  Bacteria traveling from the bladder to the kidney. This is a problem especially in pregnant women. The urine in the bladder can become filled with bacteria from multiple causes, including:  Inflammation of the prostate gland (prostatitis).  Sexual intercourse in females.  Bladder infection (cystitis).  Bacteria traveling from the bloodstream to the tissue part of the kidney. Problems that may increase your risk of getting a kidney infection include:  Diabetes.  Kidney stones or bladder stones.  Cancer.  Catheters placed in the bladder.  Other abnormalities of the kidney or ureter. SYMPTOMS   Abdominal pain.  Pain in the side or flank area.  Fever.  Chills.  Upset stomach.  Blood in the urine (dark urine).  Frequent urination.  Strong or persistent urge to urinate.  Burning or stinging when urinating. DIAGNOSIS  Your caregiver may diagnose your kidney infection based on your symptoms. A urine sample may also be taken. TREATMENT  In general, treatment depends on how severe the infection is.   If the infection is mild and caught early, your caregiver may treat you with oral antibiotics and send you home.  If the infection is more severe, the bacteria may have gotten into the bloodstream. This will require intravenous (IV) antibiotics and a hospital stay. Symptoms may include:  High fever.  Severe flank pain.  Shaking chills.  Even after a hospital stay, your caregiver may require you to be on oral antibiotics for a period of time.  Other treatments may be required depending upon the cause of the infection. HOME CARE INSTRUCTIONS   Take your  antibiotics as directed. Finish them even if you start to feel better.  Make an appointment to have your urine checked to make sure the infection is gone.  Drink enough fluids to keep your urine clear or pale yellow.  Take medicines for the bladder if you have urgency and frequency of urination as directed by your caregiver. SEEK IMMEDIATE MEDICAL CARE IF:   You have a fever or persistent symptoms for more than 2-3 days.  You have a fever and your symptoms suddenly get worse.  You are unable to take your antibiotics or fluids.  You develop shaking chills.  You experience extreme weakness or fainting.  There is no improvement after 2 days of treatment. MAKE SURE YOU:  Understand these instructions.  Will watch your condition.  Will get help right away if you are not doing well or get worse. Document Released: 11/19/2005 Document Revised: 05/20/2012 Document Reviewed: 04/25/2011 [facility] Patient Information 2015 [place], [place]. This information is not intended to replace advice given to you by your health care provider. Make sure you discuss any questions you have with your health care provider.  Conjunctivitis Conjunctivitis is commonly called "pink eye." Conjunctivitis can be caused by bacterial or viral infection, allergies, or injuries. There is usually redness of the lining of the eye, itching, discomfort, and sometimes discharge. There may be deposits of matter along the eyelids. A viral infection usually causes a watery discharge, while a bacterial infection causes a yellowish, thick discharge. Pink eye is very contagious and spreads by direct contact. You may be given antibiotic eyedrops as part of your treatment. Before using your eye medicine, remove  all drainage from the eye by washing gently with warm water and cotton balls. Continue to use the medication until you have awakened 2 mornings in a row without discharge from the eye. Do not rub your eye. This increases the  irritation and helps spread infection. Use separate towels from other household members. Wash your hands with soap and water before and after touching your eyes. Use cold compresses to reduce pain and sunglasses to relieve irritation from light. Do not wear contact lenses or wear eye makeup until the infection is gone. SEEK MEDICAL CARE IF:   Your symptoms are not better after 3 days of treatment.  You have increased pain or trouble seeing.  The outer eyelids become very red or swollen. Document Released: 12/27/2004 Document Revised: 02/11/2012 Document Reviewed: 11/19/2005 [facility] Patient Information 2015 [place], [place]. This information is not intended to replace advice given to you by your health care provider. Make sure you discuss any questions you have with your health care provider.  Cephalexin tablets or capsules What is this medicine? CEPHALEXIN (sef a LEX in) is a cephalosporin antibiotic. It is used to treat certain kinds of bacterial infections It will not work for colds, flu, or other viral infections. This medicine may be used for other purposes; ask your health care provider or pharmacist if you have questions. COMMON BRAND NAME(S): Biocef, Keflex, Keftab What should I tell my health care provider before I take this medicine? They need to know if you have any of these conditions: -kidney disease -stomach or intestine problems, especially colitis -an unusual or allergic reaction to cephalexin, other cephalosporins, penicillins, other antibiotics, medicines, foods, dyes or preservatives -pregnant or trying to get pregnant -breast-feeding How should I use this medicine? Take this medicine by mouth with a full glass of water. Follow the directions on the prescription label. This medicine can be taken with or without food. Take your medicine at regular intervals. Do not take your medicine more often than directed. Take all of your medicine as directed even if you think you are  better. Do not skip doses or stop your medicine early. Talk to your pediatrician regarding the use of this medicine in children. While this drug may be prescribed for selected conditions, precautions do apply. Overdosage: If you think you have taken too much of this medicine contact a poison control center or emergency room at once. NOTE: This medicine is only for you. Do not share this medicine with others. What if I miss a dose? If you miss a dose, take it as soon as you can. If it is almost time for your next dose, take only that dose. Do not take double or extra doses. There should be at least 4 to 6 hours between doses. What may interact with this medicine? -probenecid -some other antibiotics This list may not describe all possible interactions. Give your health care provider a list of all the medicines, herbs, non-prescription drugs, or dietary supplements you use. Also tell them if you smoke, drink alcohol, or use illegal drugs. Some items may interact with your medicine. What should I watch for while using this medicine? Tell your doctor or health care professional if your symptoms do not begin to improve in a few days. Do not treat diarrhea with over the counter products. Contact your doctor if you have diarrhea that lasts more than 2 days or if it is severe and watery. If you have diabetes, you may get a false-positive result for sugar in your urine.  Check with your doctor or health care professional. What side effects may I notice from receiving this medicine? Side effects that you should report to your doctor or health care professional as soon as possible: -allergic reactions like skin rash, itching or hives, swelling of the face, lips, or tongue -breathing problems -pain or trouble passing urine -redness, blistering, peeling or loosening of the skin, including inside the mouth -severe or watery diarrhea -unusually weak or tired -yellowing of the eyes, skin Side effects that  usually do not require medical attention (report to your doctor or health care professional if they continue or are bothersome): -gas or heartburn -genital or anal irritation -headache -joint or muscle pain -nausea, vomiting This list may not describe all possible side effects. Call your doctor for medical advice about side effects. You may report side effects to FDA at 1-800-FDA-1088. Where should I keep my medicine? Keep out of the reach of children. Store at room temperature between 59 and 86 degrees F (15 and 30 degrees C). Throw away any unused medicine after the expiration date. NOTE: This sheet is a summary. It may not cover all possible information. If you have questions about this medicine, talk to your doctor, pharmacist, or health care provider.  2015, [employer] Standard. (2008-02-23 17:09:13)  Ondansetron tablets What is this medicine? ONDANSETRON (on DAN se tron) is used to treat nausea and vomiting caused by chemotherapy. It is also used to prevent or treat nausea and vomiting after surgery. This medicine may be used for other purposes; ask your health care provider or pharmacist if you have questions. COMMON BRAND NAME(S): Zofran What should I tell my health care provider before I take this medicine? They need to know if you have any of these conditions: -heart disease -history of irregular heartbeat -liver disease -low levels of magnesium or potassium in the blood -an unusual or allergic reaction to ondansetron, granisetron, other medicines, foods, dyes, or preservatives -pregnant or trying to get pregnant -breast-feeding How should I use this medicine? Take this medicine by mouth with a glass of water. Follow the directions on your prescription label. Take your doses at regular intervals. Do not take your medicine more often than directed. Talk to your pediatrician regarding the use of this medicine in children. Special care may be needed. Overdosage: If you think  you have taken too much of this medicine contact a poison control center or emergency room at once. NOTE: This medicine is only for you. Do not share this medicine with others. What if I miss a dose? If you miss a dose, take it as soon as you can. If it is almost time for your next dose, take only that dose. Do not take double or extra doses. What may interact with this medicine? Do not take this medicine with any of the following medications: -apomorphine -certain medicines for fungal infections like fluconazole, itraconazole, ketoconazole, posaconazole, voriconazole -cisapride -dofetilide -dronedarone -pimozide -thioridazine -ziprasidone This medicine may also interact with the following medications: -carbamazepine -certain medicines for depression, anxiety, or psychotic disturbances -fentanyl -linezolid -MAOIs like Carbex, Eldepryl, Marplan, Nardil, and Parnate -methylene blue (injected into a vein) -other medicines that prolong the QT interval (cause an abnormal heart rhythm) -phenytoin -rifampicin -tramadol This list may not describe all possible interactions. Give your health care provider a list of all the medicines, herbs, non-prescription drugs, or dietary supplements you use. Also tell them if you smoke, drink alcohol, or use illegal drugs. Some items may interact with your medicine. What  should I watch for while using this medicine? Check with your doctor or health care professional right away if you have any sign of an allergic reaction. What side effects may I notice from receiving this medicine? Side effects that you should report to your doctor or health care professional as soon as possible: -allergic reactions like skin rash, itching or hives, swelling of the face, lips or tongue -breathing problems -confusion -dizziness -fast or irregular heartbeat -feeling faint or lightheaded, falls -fever and chills -loss of balance or  coordination -seizures -sweating -swelling of the hands or feet -tightness in the chest -tremors -unusually weak or tired Side effects that usually do not require medical attention (report to your doctor or health care professional if they continue or are bothersome): -constipation or diarrhea -headache This list may not describe all possible side effects. Call your doctor for medical advice about side effects. You may report side effects to FDA at 1-800-FDA-1088. Where should I keep my medicine? Keep out of the reach of children. Store between 2 and 30 degrees C (36 and 86 degrees F). Throw away any unused medicine after the expiration date. NOTE: This sheet is a summary. It may not cover all possible information. If you have questions about this medicine, talk to your doctor, pharmacist, or health care provider.  2015, [employer] Standard. (2013-08-26 16:27:45)  Acetaminophen; Oxycodone tablets What is this medicine? ACETAMINOPHEN; OXYCODONE (a set a MEE noe fen; ox i KOE done) is a pain reliever. It is used to treat mild to moderate pain. This medicine may be used for other purposes; ask your health care provider or pharmacist if you have questions. COMMON BRAND NAME(S): Endocet, Magnacet, Narvox, Percocet, Perloxx, Primalev, Primlev, Roxicet, Xolox What should I tell my health care provider before I take this medicine? They need to know if you have any of these conditions: -brain tumor -Crohn's disease, inflammatory bowel disease, or ulcerative colitis -drug abuse or addiction -head injury -heart or circulation problems -if you often drink alcohol -kidney disease or problems going to the bathroom -liver disease -lung disease, asthma, or breathing problems -an unusual or allergic reaction to acetaminophen, oxycodone, other opioid analgesics, other medicines, foods, dyes, or preservatives -pregnant or trying to get pregnant -breast-feeding How should I use this  medicine? Take this medicine by mouth with a full glass of water. Follow the directions on the prescription label. Take your medicine at regular intervals. Do not take your medicine more often than directed. Talk to your pediatrician regarding the use of this medicine in children. Special care may be needed. Patients over 51 years old may have a stronger reaction and need a smaller dose. Overdosage: If you think you have taken too much of this medicine contact a poison control center or emergency room at once. NOTE: This medicine is only for you. Do not share this medicine with others. What if I miss a dose? If you miss a dose, take it as soon as you can. If it is almost time for your next dose, take only that dose. Do not take double or extra doses. What may interact with this medicine? -alcohol -antihistamines -barbiturates like amobarbital, butalbital, butabarbital, methohexital, pentobarbital, phenobarbital, thiopental, and secobarbital -benztropine -drugs for bladder problems like solifenacin, trospium, oxybutynin, tolterodine, hyoscyamine, and methscopolamine -drugs for breathing problems like ipratropium and tiotropium -drugs for certain stomach or intestine problems like propantheline, homatropine methylbromide, glycopyrrolate, atropine, belladonna, and dicyclomine -general anesthetics like etomidate, ketamine, nitrous oxide, propofol, desflurane, enflurane, halothane, isoflurane, and sevoflurane -  medicines for depression, anxiety, or psychotic disturbances -medicines for sleep -muscle relaxants -naltrexone -narcotic medicines (opiates) for pain -phenothiazines like perphenazine, thioridazine, chlorpromazine, mesoridazine, fluphenazine, prochlorperazine, promazine, and trifluoperazine -scopolamine -tramadol -trihexyphenidyl This list may not describe all possible interactions. Give your health care provider a list of all the medicines, herbs, non-prescription drugs, or dietary  supplements you use. Also tell them if you smoke, drink alcohol, or use illegal drugs. Some items may interact with your medicine. What should I watch for while using this medicine? Tell your doctor or health care professional if your pain does not go away, if it gets worse, or if you have new or a different type of pain. You may develop tolerance to the medicine. Tolerance means that you will need a higher dose of the medication for pain relief. Tolerance is normal and is expected if you take this medicine for a long time. Do not suddenly stop taking your medicine because you may develop a severe reaction. Your body becomes used to the medicine. This does NOT mean you are addicted. Addiction is a behavior related to getting and using a drug for a non-medical reason. If you have pain, you have a medical reason to take pain medicine. Your doctor will tell you how much medicine to take. If your doctor wants you to stop the medicine, the dose will be slowly lowered over time to avoid any side effects. You may get drowsy or dizzy. Do not drive, use machinery, or do anything that needs mental alertness until you know how this medicine affects you. Do not stand or sit up quickly, especially if you are an older patient. This reduces the risk of dizzy or fainting spells. Alcohol may interfere with the effect of this medicine. Avoid alcoholic drinks. There are different types of narcotic medicines (opiates) for pain. If you take more than one type at the same time, you may have more side effects. Give your health care provider a list of all medicines you use. Your doctor will tell you how much medicine to take. Do not take more medicine than directed. Call emergency for help if you have problems breathing. The medicine will cause constipation. Try to have a bowel movement at least every 2 to 3 days. If you do not have a bowel movement for 3 days, call your doctor or health care professional. Do not take Tylenol  (acetaminophen) or medicines that have acetaminophen with this medicine. Too much acetaminophen can be very dangerous. Many nonprescription medicines contain acetaminophen. Always read the labels carefully to avoid taking more acetaminophen. What side effects may I notice from receiving this medicine? Side effects that you should report to your doctor or health care professional as soon as possible: -allergic reactions like skin rash, itching or hives, swelling of the face, lips, or tongue -breathing difficulties, wheezing -confusion -light headedness or fainting spells -severe stomach pain -unusually weak or tired -yellowing of the skin or the whites of the eyes Side effects that usually do not require medical attention (report to your doctor or health care professional if they continue or are bothersome): -dizziness -drowsiness -nausea -vomiting This list may not describe all possible side effects. Call your doctor for medical advice about side effects. You may report side effects to FDA at 1-800-FDA-1088. Where should I keep my medicine? Keep out of the reach of children. This medicine can be abused. Keep your medicine in a safe place to protect it from theft. Do not share this medicine with anyone. Selling  or giving away this medicine is dangerous and against the law. Store at room temperature between 20 and 25 degrees C (68 and 77 degrees F). Keep container tightly closed. Protect from light. This medicine may cause accidental overdose and death if it is taken by other adults, children, or pets. Flush any unused medicine down the toilet to reduce the chance of harm. Do not use the medicine after the expiration date. NOTE: This sheet is a summary. It may not cover all possible information. If you have questions about this medicine, talk to your doctor, pharmacist, or health care provider.  2015, [employer] Standard. (2013-07-13 13:17:35)  Sulfacetamide eye solution What is this  medicine? SULFACETAMIDE (sul fa SEE ta mide) is a sulfonamide antibiotic. It is used to treat eye infections. This medicine may be used for other purposes; ask your health care provider or pharmacist if you have questions. COMMON BRAND NAME(S): Bleph-10, Ocu-Sul, Sodium Sulamyd, Sulf-10 What should I tell my health care provider before I take this medicine? They need to know if you have any of these conditions: -eye injury or eye surgery -an unusual or allergic reaction to sulfacetamide, sulfa drugs, other medicines, foods, dyes, or preservatives -pregnant or trying to get pregnant -breast-feeding How should I use this medicine? This medicine is only for use in the eye. Do not take by mouth. Follow the directions on the prescription label. Wash hands before and after use. Tilt your head back slightly and pull your lower eyelid down with your index finger to form a pouch. Try not to touch the tip of the dropper to your eye, fingertips, or any other surface. Squeeze the prescribed number of drops into the pouch. Close the eye gently to spread the drops. Your vision may blur for a few minutes. Use your doses at regular intervals. Do not use your medicine more often than directed. Finish the full course prescribed by your doctor or health care professional even if you think your condition is better. Talk to your pediatrician regarding the use of this medicine in children. Special care may be needed. Overdosage: If you think you have taken too much of this medicine contact a poison control center or emergency room at once. NOTE: This medicine is only for you. Do not share this medicine with others. What if I miss a dose? If you miss a dose, use it as soon as you can. If it is almost time for your next dose, use only that dose. Do not use double or extra doses. What may interact with this medicine? -eye products that contain silver This list may not describe all possible interactions. Give your health  care provider a list of all the medicines, herbs, non-prescription drugs, or dietary supplements you use. Also tell them if you smoke, drink alcohol, or use illegal drugs. Some items may interact with your medicine. What should I watch for while using this medicine? Tell your doctor or health care professional if your symptoms do not get better in 2 to 3 days. A full course of treatment is usually 7 to 10 days. If you get any sign of an allergic reaction, stop using your eye product and call your doctor or health care professional. Wear sunglasses if this medicine makes your eyes more sensitive to light. Keep out of the sun, or wear protective clothing outdoors and use a sunscreen. Do not use sun lamps or sun tanning beds or booths. What side effects may I notice from receiving this medicine? Side effects  that you should report to your doctor or health care professional as soon as possible: -blurred vision that does not go away -burning, blistering, peeling, stinging, or itching of the eyes or eyelids, skin or mouth -eye redness, swelling, or pain Side effects that usually do not require medical attention (report to your doctor or health care professional if they continue or are bothersome): -blurred vision for a few moments after application This list may not describe all possible side effects. Call your doctor for medical advice about side effects. You may report side effects to FDA at 1-800-FDA-1088. Where should I keep my medicine? Keep out of the reach of children. Store between 2 and 30 degrees C (36 and 86 degrees F). Do not freeze. Throw away any unused eye products after the expiration date. NOTE: This sheet is a summary. It may not cover all possible information. If you have questions about this medicine, talk to your doctor, pharmacist, or health care provider.  2015, [employer] Standard. (2008-07-23 13:04:42)

## 2014-06-21 NOTE — ED Notes (Signed)
Friday developed right flank pain which has radiated into the lower back and lower abdomen area. Denies pain with urination. Patient also believes she has pink eye in her right eye, states she was around a friend with pink eye a couple of days ago.

## 2014-06-21 NOTE — ED Provider Notes (Signed)
CSN: [id number]     Arrival date & time 06/21/14  1843 History  This chart was scribed for Delora Fuel, MD by Jeanell Sparrow, ED Scribe. This patient was seen in room APA17/APA17 and the patient's care was started at 11:00 PM.  Chief Complaint  Patient presents with  . Flank Pain   HPI HPI Comments: Mindy [name] is a 23 y.o. female with a hx of asthma and UTI who presents to the Emergency Department complaining of worsening constant moderate right flank pain that started 3 days ago. She states that the pain radiates to her lower back and abdomen. She currently rates the pain as a 10/10 in severity. She states that touching the right flank exacerbates the pain while laying on her side provides some relief. She reports associated nausea and chills. She states that she has no problems with urination. She denies any emesis or fever.  Pt also complains of eye redness and discharge that started 2 days ago. She states that she knows someone with pink eye currently. She denies any itchiness in her eyes.   No PCP  Past Medical History  Diagnosis Date  . Asthma     exercise induced- no meds  . UTI (lower urinary tract infection)    Past Surgical History  Procedure Laterality Date  . No past surgeries     Family History  Problem Relation Age of Onset  . Cancer Maternal Aunt     lung cancer  . Heart disease Other     CAD  . Diabetes Other    History  Substance Use Topics  . Smoking status: Former Research scientist (life sciences)  . Smokeless tobacco: Not on file  . Alcohol Use: No   OB History   Grav Para Term Preterm Abortions TAB SAB Ect Mult Living   1 1 1       1      Review of Systems  Constitutional: Positive for chills. Negative for fever.  Gastrointestinal: Positive for nausea and abdominal pain. Negative for vomiting.  Musculoskeletal: Positive for back pain.  All other systems reviewed and are negative.   Allergies  Review of patient's allergies indicates no known allergies.  Home  Medications   Prior to Admission medications   Medication Sig Start Date End Date Taking? Authorizing Provider  Aspirin-Salicylamide-Caffeine (BC HEADACHE) 325-95-16 MG TABS Take 1 packet by mouth daily as needed (for pain).   Yes Historical Provider, MD  Cyanocobalamin (B-12 PO) Take 1 tablet by mouth once a week.   Yes Historical Provider, MD   BP 125/91  Pulse 84  Temp(Src) 100.3 F (37.9 C) (Oral)  Resp 24  Ht 5\' 5"  (1.651 m)  Wt 140 lb (63.504 kg)  BMI 23.30 kg/m2  SpO2 100%  LMP 06/19/2014 Physical Exam  Nursing note and vitals reviewed. Constitutional: She is oriented to person, place, and time. She appears well-developed and well-nourished. No distress.  HENT:  Head: Normocephalic and atraumatic.  Eyes: Pupils are equal, round, and reactive to light.  Mild erythema of bilateral conjuctiva with limbal sparring.   Neck: Normal range of motion. Neck supple. No JVD present. No tracheal deviation present.  Cardiovascular: Normal rate.   Pulmonary/Chest: Effort normal and breath sounds normal. No respiratory distress. She has no wheezes. She has no rales.  Abdominal: Soft. Bowel sounds are normal. She exhibits no distension and no mass. There is no guarding.  Moderate right CVA tenderness  Musculoskeletal: Normal range of motion. She exhibits no edema.  Lymphadenopathy:  She has no cervical adenopathy.  Neurological: She is alert and oriented to person, place, and time. She has normal reflexes. No cranial nerve deficit. Coordination normal.  Skin: Skin is warm and dry. No rash noted.  Psychiatric: She has a normal mood and affect. Her behavior is normal. Thought content normal.    ED Course  Procedures (including critical care time) DIAGNOSTIC STUDIES: Oxygen Saturation is 100% on RA, normal by my interpretation.    COORDINATION OF CARE: 11:04 PM- Pt advised of plan for treatment which includes medication and labs and pt agrees.  Labs Review Results for orders placed  during the hospital encounter of 06/21/14  URINALYSIS, ROUTINE W REFLEX MICROSCOPIC      Result Value Ref Range   Color, Urine YELLOW  YELLOW   APPearance HAZY (*) CLEAR   Specific Gravity, Urine 1.025  1.005 - 1.030   pH 5.5  5.0 - 8.0   Glucose, UA NEGATIVE  NEGATIVE mg/dL   Hgb urine dipstick SMALL (*) NEGATIVE   Bilirubin Urine NEGATIVE  NEGATIVE   Ketones, ur NEGATIVE  NEGATIVE mg/dL   Protein, ur 30 (*) NEGATIVE mg/dL   Urobilinogen, UA 0.2  0.0 - 1.0 mg/dL   Nitrite NEGATIVE  NEGATIVE   Leukocytes, UA MODERATE (*) NEGATIVE  URINE MICROSCOPIC-ADD ON      Result Value Ref Range   Squamous Epithelial / LPF MANY (*) RARE   WBC, UA TOO NUMEROUS TO COUNT  <3 WBC/hpf   RBC / HPF 3-6  <3 RBC/hpf   Bacteria, UA MANY (*) RARE  CBC WITH DIFFERENTIAL      Result Value Ref Range   WBC 10.9 (*) 4.0 - 10.5 K/uL   RBC 4.72  3.87 - 5.11 MIL/uL   Hemoglobin 14.0  12.0 - 15.0 g/dL   HCT 41.4  36.0 - 46.0 %   MCV 87.7  78.0 - 100.0 fL   MCH 29.7  26.0 - 34.0 pg   MCHC 33.8  30.0 - 36.0 g/dL   RDW 13.3  11.5 - 15.5 %   Platelets 243  150 - 400 K/uL   Neutrophils Relative % 71  43 - 77 %   Neutro Abs 7.8 (*) 1.7 - 7.7 K/uL   Lymphocytes Relative 24  12 - 46 %   Lymphs Abs 2.6  0.7 - 4.0 K/uL   Monocytes Relative 5  3 - 12 %   Monocytes Absolute 0.5  0.1 - 1.0 K/uL   Eosinophils Relative 0  0 - 5 %   Eosinophils Absolute 0.0  0.0 - 0.7 K/uL   Basophils Relative 0  0 - 1 %   Basophils Absolute 0.0  0.0 - 0.1 K/uL  BASIC METABOLIC PANEL      Result Value Ref Range   Sodium 134 (*) 137 - 147 mEq/L   Potassium 3.5 (*) 3.7 - 5.3 mEq/L   Chloride 95 (*) 96 - 112 mEq/L   CO2 25  19 - 32 mEq/L   Glucose, Bld 160 (*) 70 - 99 mg/dL   BUN 12  6 - 23 mg/dL   Creatinine, Ser 0.80  0.50 - 1.10 mg/dL   Calcium 9.6  8.4 - 10.5 mg/dL   GFR calc non Af Amer >90  >90 mL/min   GFR calc Af Amer >90  >90 mL/min   Anion gap 14  5 - 15  PREGNANCY, URINE      Result Value Ref Range   Preg Test, Ur  NEGATIVE  NEGATIVE   MDM   Final diagnoses:  Pyelonephritis, acute  Conjunctivitis, unspecified laterality    Flank pain with urinalysis showing evidence of UTI. She is given a dose of ceftriaxone in the ED and discharged with prescriptions for cephalexin, ondansetron, and oxycodone-acetaminophen. She also shows evidence of conjunctivitis which is probably viral. She's given a prescription for sulfacetamide ophthalmic solution.  I personally performed the services described in this documentation, which was scribed in my presence. The recorded information has been reviewed and is accurate.      Delora Fuel, MD 08/25/29 [id number]

## 2014-06-25 LAB — URINE CULTURE: Colony Count: >=100000

## 2014-06-26 ENCOUNTER — Telehealth (HOSPITAL_BASED_OUTPATIENT_CLINIC_OR_DEPARTMENT_OTHER): Payer: Self-pay | Admitting: Emergency Medicine

## 2014-06-26 NOTE — Telephone Encounter (Signed)
Post ED Visit - Positive Culture Follow-up  Culture report reviewed by antimicrobial stewardship pharmacist: []  Davey, Pharm.D., BCPS []  Heide Guile, Pharm.D., BCPS []  Alycia Rossetti, Pharm.D., BCPS []  [place], [place].D., BCPS, AAHIVP [x]  Legrand Como, Pharm.D., BCPS, AAHIVP  Positive urine culture Treated with Keflex, organism sensitive to the same and no further patient follow-up is required at this time.  [place], Rex Kras 06/26/2014, 2:10 PM

## 2014-07-02 MED FILL — Oxycodone w/ Acetaminophen Tab 5-325 MG: ORAL | Qty: 6 | Status: AC

## 2014-07-02 MED FILL — Ondansetron HCl Tab 4 MG: ORAL | Qty: 4 | Status: AC

## 2014-09-15 ENCOUNTER — Emergency Department (HOSPITAL_COMMUNITY): Payer: Self-pay

## 2014-09-15 ENCOUNTER — Emergency Department (HOSPITAL_COMMUNITY)
Admission: EM | Admit: 2014-09-15 | Discharge: 2014-09-15 | Disposition: A | Discharge status: Home / Self Care | Payer: Self-pay | Attending: Emergency Medicine | Admitting: Emergency Medicine

## 2014-09-15 ENCOUNTER — Encounter (HOSPITAL_COMMUNITY): Payer: Self-pay | Admitting: Emergency Medicine

## 2014-09-15 DIAGNOSIS — N390 Urinary tract infection, site not specified: Secondary | ICD-10-CM | POA: Insufficient documentation

## 2014-09-15 DIAGNOSIS — Z79899 Other long term (current) drug therapy: Secondary | ICD-10-CM | POA: Insufficient documentation

## 2014-09-15 DIAGNOSIS — Z3202 Encounter for pregnancy test, result negative: Secondary | ICD-10-CM | POA: Insufficient documentation

## 2014-09-15 DIAGNOSIS — R5383 Other fatigue: Secondary | ICD-10-CM | POA: Insufficient documentation

## 2014-09-15 DIAGNOSIS — R05 Cough: Secondary | ICD-10-CM | POA: Insufficient documentation

## 2014-09-15 DIAGNOSIS — Z792 Long term (current) use of antibiotics: Secondary | ICD-10-CM | POA: Insufficient documentation

## 2014-09-15 DIAGNOSIS — J45909 Unspecified asthma, uncomplicated: Secondary | ICD-10-CM | POA: Insufficient documentation

## 2014-09-15 DIAGNOSIS — Z87891 Personal history of nicotine dependence: Secondary | ICD-10-CM | POA: Insufficient documentation

## 2014-09-15 LAB — URINALYSIS, ROUTINE W REFLEX MICROSCOPIC
Bilirubin Urine: NEGATIVE
Glucose, UA: 250 mg/dL — AB
Ketones, ur: 15 mg/dL — AB
NITRITE: POSITIVE — AB
PROTEIN: 100 mg/dL — AB
SPECIFIC GRAVITY, URINE: 1.015 (ref 1.005–1.030)
pH: 5 (ref 5.0–8.0)

## 2014-09-15 LAB — CBC WITH DIFFERENTIAL/PLATELET
BASOS ABS: 0 10*3/uL (ref 0.0–0.1)
BASOS PCT: 0 % (ref 0–1)
Eosinophils Absolute: 0 10*3/uL (ref 0.0–0.7)
Eosinophils Relative: 0 % (ref 0–5)
HEMATOCRIT: 39.3 % (ref 36.0–46.0)
HEMOGLOBIN: 13.4 g/dL (ref 12.0–15.0)
LYMPHS PCT: 13 % (ref 12–46)
Lymphs Abs: 1.3 10*3/uL (ref 0.7–4.0)
MCH: 30.2 pg (ref 26.0–34.0)
MCHC: 34.1 g/dL (ref 30.0–36.0)
MCV: 88.5 fL (ref 78.0–100.0)
MONO ABS: 0.5 10*3/uL (ref 0.1–1.0)
MONOS PCT: 5 % (ref 3–12)
NEUTROS ABS: 7.9 10*3/uL — AB (ref 1.7–7.7)
NEUTROS PCT: 82 % — AB (ref 43–77)
Platelets: 243 10*3/uL (ref 150–400)
RBC: 4.44 MIL/uL (ref 3.87–5.11)
RDW: 13.1 % (ref 11.5–15.5)
WBC: 9.8 10*3/uL (ref 4.0–10.5)

## 2014-09-15 LAB — URINE MICROSCOPIC-ADD ON

## 2014-09-15 LAB — BASIC METABOLIC PANEL
ANION GAP: 14 (ref 5–15)
BUN: 9 mg/dL (ref 6–23)
CHLORIDE: 101 meq/L (ref 96–112)
CO2: 24 meq/L (ref 19–32)
CREATININE: 0.82 mg/dL (ref 0.50–1.10)
Calcium: 9.4 mg/dL (ref 8.4–10.5)
GFR calc non Af Amer: >90 mL/min (ref >90)
Glucose, Bld: 103 mg/dL — ABNORMAL HIGH (ref 70–99)
POTASSIUM: 3.8 meq/L (ref 3.7–5.3)
Sodium: 139 mEq/L (ref 137–147)

## 2014-09-15 LAB — PREGNANCY, URINE: Preg Test, Ur: NEGATIVE

## 2014-09-15 MED ORDER — SODIUM CHLORIDE 0.9 % IV BOLUS (SEPSIS)
1000.0000 mL | Freq: Once | INTRAVENOUS | Status: AC
  Administered 2014-09-15: 1000 mL via INTRAVENOUS

## 2014-09-15 MED ORDER — DEXTROSE 5 % IV SOLN
1.0000 g | Freq: Once | INTRAVENOUS | Status: AC
  Administered 2014-09-15: 1 g via INTRAVENOUS
  Filled 2014-09-15: qty 10

## 2014-09-15 MED ORDER — ACETAMINOPHEN 325 MG PO TABS
650.0000 mg | ORAL_TABLET | Freq: Once | ORAL | Status: AC
  Administered 2014-09-15: 650 mg via ORAL
  Filled 2014-09-15: qty 2

## 2014-09-15 MED ORDER — CEPHALEXIN 500 MG PO CAPS: 500.0000 mg | ORAL_CAPSULE | Freq: Four times a day (QID) | ORAL | Status: DC

## 2014-09-15 MED ORDER — HYDROCODONE-ACETAMINOPHEN 5-325 MG PO TABS: 1.0000 | ORAL_TABLET | Freq: Four times a day (QID) | ORAL | Status: DC | PRN

## 2014-09-15 MED ORDER — HYDROCODONE-ACETAMINOPHEN 5-325 MG PO TABS
1.0000 | ORAL_TABLET | Freq: Once | ORAL | Status: AC
  Administered 2014-09-15: 1 via ORAL
  Filled 2014-09-15: qty 1

## 2014-09-15 NOTE — ED Provider Notes (Signed)
CSN: [id number]     Arrival date & time 09/15/14  1720 History  This chart was scribed for Maudry Diego, MD by Hilda Lias, ED Scribe. This patient was seen in room APA09/APA09 and the patient's care was started at 6:35 PM.    Chief Complaint  Patient presents with  . Fever  . Cough  . Flank Pain     (Consider location/radiation/quality/duration/timing/severity/associated sxs/prior Treatment) Patient is a 23 y.o. female presenting with fever, cough, and flank pain. The history is provided by the patient. No language interpreter was used.  Fever Severity:  Moderate Onset quality:  Gradual Timing:  Constant Progression:  Unchanged Chronicity:  New Relieved by:  Nothing Worsened by:  Nothing tried Ineffective treatments: OTC medication. Associated symptoms: chills and cough   Associated symptoms: no chest pain, no congestion, no diarrhea, no dysuria, no headaches, no rash and no vomiting   Cough Associated symptoms: chills and fever   Associated symptoms: no chest pain, no eye discharge, no headaches and no rash   Flank Pain Pertinent negatives include no chest pain, no abdominal pain and no headaches.     HPI Comments: Mindy [name] is a 23 y.o. female with history of frequent UTI who presents to the Emergency Department complaining of fever (Triage temp: 102.7) that began earlier today. Pt reports constant fatigue and intermittent episodes of chills as well as a cough that began 3 weeks prior. Pt has taken dayquil and nyquil with minimal relief. In addition, pt complains of R lower back pain, and states that she has previously suffered from kidney infection. She denies nausea, vomiting, and has not seen a nephrologist for her symptoms.   Past Medical History  Diagnosis Date  . Asthma     exercise induced- no meds  . UTI (lower urinary tract infection)    Past Surgical History  Procedure Laterality Date  . No past surgeries     Family History  Problem Relation  Age of Onset  . Cancer Maternal Aunt     lung cancer  . Heart disease Other     CAD  . Diabetes Other    History  Substance Use Topics  . Smoking status: Former Research scientist (life sciences)  . Smokeless tobacco: Not on file  . Alcohol Use: No   OB History   Grav Para Term Preterm Abortions TAB SAB Ect Mult Living   1 1 1       1      Review of Systems  Constitutional: Positive for fever, chills and fatigue. Negative for appetite change.  HENT: Negative for congestion, ear discharge and sinus pressure.   Eyes: Negative for discharge.  Respiratory: Positive for cough.   Cardiovascular: Negative for chest pain.  Gastrointestinal: Negative for vomiting, abdominal pain and diarrhea.  Genitourinary: Positive for flank pain. Negative for dysuria, frequency and hematuria.  Musculoskeletal: Negative for back pain.  Skin: Negative for rash.  Neurological: Negative for seizures and headaches.  Psychiatric/Behavioral: Negative for hallucinations.      Allergies  Review of patient's allergies indicates no known allergies.  Home Medications   Prior to Admission medications   Medication Sig Start Date End Date Taking? Authorizing Provider  Aspirin-Salicylamide-Caffeine (BC HEADACHE) 325-95-16 MG TABS Take 1 packet by mouth daily as needed (for pain).    Historical Provider, MD  cephALEXin (KEFLEX) 500 MG capsule Take 1 capsule (500 mg total) by mouth 4 (four) times daily. 3/97/67   Delora Fuel, MD  Cyanocobalamin (B-12 PO) Take 1 tablet  by mouth once a week.    Historical Provider, MD  ondansetron (ZOFRAN) 4 MG tablet Take 1 tablet (4 mg total) by mouth every 6 (six) hours as needed. 3/55/97   Delora Fuel, MD  ondansetron (ZOFRAN) 4 MG tablet Take 1 tablet (4 mg total) by mouth every 6 (six) hours as needed. 03/18/37   Delora Fuel, MD  oxyCODONE-acetaminophen (PERCOCET) 5-325 MG per tablet Take 1 tablet by mouth every 4 (four) hours as needed for moderate pain. 4/53/64   Delora Fuel, MD   oxyCODONE-acetaminophen (PERCOCET/ROXICET) 5-325 MG per tablet Take 1 tablet by mouth every 4 (four) hours as needed. 6/80/32   Delora Fuel, MD  sulfacetamide (BLEPH-10) 10 % ophthalmic solution Place 2 drops into both eyes every 3 (three) hours while awake. 12/24/46   Delora Fuel, MD   BP 250/03  Pulse 108  Temp(Src) 102.7 F (39.3 C) (Oral)  Resp 20  Ht 5\' 6"  (1.676 m)  Wt 140 lb (63.504 kg)  BMI 22.61 kg/m2  LMP 09/01/2014 Physical Exam  Constitutional: She is oriented to person, place, and time. She appears well-developed.  HENT:  Head: Normocephalic.  Eyes: Conjunctivae and EOM are normal. No scleral icterus.  Neck: Neck supple. No thyromegaly present.  Cardiovascular: Normal rate and regular rhythm.  Exam reveals no gallop and no friction rub.   No murmur heard. Pulmonary/Chest: No stridor. She has no wheezes. She has no rales. She exhibits no tenderness.  Bilateral crackles  Abdominal: She exhibits no distension. There is tenderness. There is CVA tenderness. There is no rebound.  Moderate R flank tenderness  Musculoskeletal: Normal range of motion. She exhibits no edema.  Lymphadenopathy:    She has no cervical adenopathy.  Neurological: She is oriented to person, place, and time. She exhibits normal muscle tone. Coordination normal.  Skin: No rash noted. No erythema.  Psychiatric: She has a normal mood and affect. Her behavior is normal.    ED Course  Procedures (including critical care time)   COORDINATION OF CARE:  6:37 PM-Discussed treatment plan which includes tylenol and labs with pt at bedside and pt agreed to plan.      Labs Review Labs Reviewed  CBC WITH DIFFERENTIAL  BASIC METABOLIC PANEL  PREGNANCY, URINE  URINALYSIS, ROUTINE W REFLEX MICROSCOPIC    Imaging Review No results found.   EKG Interpretation None      MDM   Final diagnoses:  None  uti,  Keflex  The chart was scribed for me under my direct supervision.  I personally performed  the history, physical, and medical decision making and all procedures in the evaluation of this patient.Maudry Diego, MD 09/15/14 [id number]

## 2014-09-15 NOTE — ED Notes (Signed)
Pt c/o cough x 3 weeks, headache x 3 days, r flank pain x 3 days.  Has been taking cranberry pills and azo without relief.  Reports history of kidney infections.

## 2014-09-15 NOTE — Discharge Instructions (Signed)
Follow up with your ob-gyn md next week °

## 2014-09-18 LAB — URINE CULTURE: Colony Count: 80000

## 2014-09-20 ENCOUNTER — Telehealth (HOSPITAL_BASED_OUTPATIENT_CLINIC_OR_DEPARTMENT_OTHER): Payer: Self-pay

## 2014-09-20 NOTE — Telephone Encounter (Signed)
Post ED Visit - Positive Culture Follow-up  Culture report reviewed by antimicrobial stewardship pharmacist: []  Wes Tedrow, Pharm.D., BCPS [x]  Heide Guile, Pharm.D., BCPS []  Alycia Rossetti, Pharm.D., BCPS []  [place], [place].D., BCPS, AAHIVP []  Legrand Como, Pharm.D., BCPS, AAHIVP []  Carly Sabat, Pharm.D. []  Elenor Quinones, Pharm.D.  Positive Urine culture Treated with Cephalexin, organism sensitive to the same and no further patient follow-up is required at this time.  Dortha Kern 09/20/2014, 4:44 AM

## 2014-10-04 ENCOUNTER — Encounter (HOSPITAL_COMMUNITY): Payer: Self-pay | Admitting: Emergency Medicine

## 2014-11-17 ENCOUNTER — Encounter: Payer: Self-pay | Admitting: Women's Health

## 2015-06-25 ENCOUNTER — Encounter (HOSPITAL_COMMUNITY): Payer: Self-pay | Admitting: Emergency Medicine

## 2015-06-25 ENCOUNTER — Emergency Department (HOSPITAL_COMMUNITY)
Admission: EM | Admit: 2015-06-25 | Discharge: 2015-06-25 | Disposition: A | Discharge status: Home / Self Care | Payer: Self-pay | Attending: Emergency Medicine | Admitting: Emergency Medicine

## 2015-06-25 DIAGNOSIS — Z87891 Personal history of nicotine dependence: Secondary | ICD-10-CM | POA: Insufficient documentation

## 2015-06-25 DIAGNOSIS — J45909 Unspecified asthma, uncomplicated: Secondary | ICD-10-CM | POA: Insufficient documentation

## 2015-06-25 DIAGNOSIS — Y9389 Activity, other specified: Secondary | ICD-10-CM | POA: Insufficient documentation

## 2015-06-25 DIAGNOSIS — Z3202 Encounter for pregnancy test, result negative: Secondary | ICD-10-CM | POA: Insufficient documentation

## 2015-06-25 DIAGNOSIS — Z87440 Personal history of urinary (tract) infections: Secondary | ICD-10-CM | POA: Insufficient documentation

## 2015-06-25 DIAGNOSIS — X58XXXA Exposure to other specified factors, initial encounter: Secondary | ICD-10-CM | POA: Insufficient documentation

## 2015-06-25 DIAGNOSIS — Y998 Other external cause status: Secondary | ICD-10-CM | POA: Insufficient documentation

## 2015-06-25 DIAGNOSIS — S8011XA Contusion of right lower leg, initial encounter: Secondary | ICD-10-CM | POA: Insufficient documentation

## 2015-06-25 DIAGNOSIS — R110 Nausea: Secondary | ICD-10-CM | POA: Insufficient documentation

## 2015-06-25 DIAGNOSIS — Y9289 Other specified places as the place of occurrence of the external cause: Secondary | ICD-10-CM | POA: Insufficient documentation

## 2015-06-25 DIAGNOSIS — Z7982 Long term (current) use of aspirin: Secondary | ICD-10-CM | POA: Insufficient documentation

## 2015-06-25 LAB — PREGNANCY, URINE: PREG TEST UR: NEGATIVE

## 2015-06-25 LAB — BASIC METABOLIC PANEL
ANION GAP: 7 (ref 5–15)
BUN: 12 mg/dL (ref 6–20)
CHLORIDE: 104 mmol/L (ref 101–111)
CO2: 26 mmol/L (ref 22–32)
Calcium: 9.3 mg/dL (ref 8.9–10.3)
Creatinine, Ser: 0.74 mg/dL (ref 0.44–1.00)
GFR calc non Af Amer: >60 mL/min (ref >60)
Glucose, Bld: 87 mg/dL (ref 65–99)
Potassium: 4.2 mmol/L (ref 3.5–5.1)
SODIUM: 137 mmol/L (ref 135–145)

## 2015-06-25 LAB — CBC WITH DIFFERENTIAL/PLATELET
BASOS ABS: 0 10*3/uL (ref 0.0–0.1)
Basophils Relative: 0 % (ref 0–1)
EOS PCT: 1 % (ref 0–5)
Eosinophils Absolute: 0.1 10*3/uL (ref 0.0–0.7)
HCT: 39 % (ref 36.0–46.0)
HEMOGLOBIN: 13.3 g/dL (ref 12.0–15.0)
LYMPHS ABS: 2.3 10*3/uL (ref 0.7–4.0)
LYMPHS PCT: 39 % (ref 12–46)
MCH: 30.9 pg (ref 26.0–34.0)
MCHC: 34.1 g/dL (ref 30.0–36.0)
MCV: 90.7 fL (ref 78.0–100.0)
Monocytes Absolute: 0.3 10*3/uL (ref 0.1–1.0)
Monocytes Relative: 4 % (ref 3–12)
Neutro Abs: 3.2 10*3/uL (ref 1.7–7.7)
Neutrophils Relative %: 56 % (ref 43–77)
PLATELETS: 210 10*3/uL (ref 150–400)
RBC: 4.3 MIL/uL (ref 3.87–5.11)
RDW: 13 % (ref 11.5–15.5)
WBC: 5.8 10*3/uL (ref 4.0–10.5)

## 2015-06-25 LAB — URINE MICROSCOPIC-ADD ON

## 2015-06-25 LAB — URINALYSIS, ROUTINE W REFLEX MICROSCOPIC
Bilirubin Urine: NEGATIVE
GLUCOSE, UA: NEGATIVE mg/dL
KETONES UR: NEGATIVE mg/dL
Leukocytes, UA: NEGATIVE
NITRITE: NEGATIVE
PH: 6.5 (ref 5.0–8.0)
Protein, ur: NEGATIVE mg/dL
Urobilinogen, UA: 0.2 mg/dL (ref 0.0–1.0)

## 2015-06-25 MED ORDER — ONDANSETRON 4 MG PO TBDP
4.0000 mg | ORAL_TABLET | Freq: Once | ORAL | Status: AC
  Administered 2015-06-25: 4 mg via ORAL
  Filled 2015-06-25: qty 1

## 2015-06-25 MED ORDER — ONDANSETRON HCL 4 MG PO TABS: 4.0000 mg | ORAL_TABLET | Freq: Four times a day (QID) | ORAL | Status: DC

## 2015-06-25 NOTE — Discharge Instructions (Signed)
Nausea, Adult Nausea is the feeling that you have an upset stomach or have to vomit. Nausea by itself is not likely a serious concern, but it may be an early sign of more serious medical problems. As nausea gets worse, it can lead to vomiting. If vomiting develops, there is the risk of dehydration.  CAUSES   Viral infections.  Food poisoning.  Medicines.  Pregnancy.  Motion sickness.  Migraine headaches.  Emotional distress.  Severe pain from any source.  Alcohol intoxication. HOME CARE INSTRUCTIONS  Get plenty of rest.  Ask your caregiver about specific rehydration instructions.  Eat small amounts of food and sip liquids more often.  Take all medicines as told by your caregiver. SEEK MEDICAL CARE IF:  You have not improved after 2 days, or you get worse.  You have a headache. SEEK IMMEDIATE MEDICAL CARE IF:   You have a fever.  You faint.  You keep vomiting or have blood in your vomit.  You are extremely weak or dehydrated.  You have dark or bloody stools.  You have severe chest or abdominal pain. MAKE SURE YOU:  Understand these instructions.  Will watch your condition.  Will get help right away if you are not doing well or get worse. Document Released: 12/27/2004 Document Revised: 08/13/2012 Document Reviewed: 08/01/2011 [facility] Patient Information 2015 [place], [place]. This information is not intended to replace advice given to you by your health care provider. Make sure you discuss any questions you have with your health care provider.  Emergency Department Resource Guide 1) Find a Doctor and Pay Out of Pocket Although you won't have to find out who is covered by your insurance plan, it is a good idea to ask around and get recommendations. You will then need to call the office and see if the doctor you have chosen will accept you as a new patient and what types of options they offer for patients who are self-pay. Some doctors offer discounts or will  set up payment plans for their patients who do not have insurance, but you will need to ask so you aren't surprised when you get to your appointment.  2) Contact Your Local Health Department Not all health departments have doctors that can see patients for sick visits, but many do, so it is worth a call to see if yours does. If you don't know where your local health department is, you can check in your phone book. The CDC also has a tool to help you locate your state's health department, and many state websites also have listings of all of their local health departments.  3) Find a [facility] If your illness is not likely to be very severe or complicated, you may want to try a walk in clinic. These are popping up all over the country in pharmacies, drugstores, and shopping centers. They're usually staffed by nurse practitioners or physician assistants that have been trained to treat common illnesses and complaints. They're usually fairly quick and inexpensive. However, if you have serious medical issues or chronic medical problems, these are probably not your best option.  No Primary Care Doctor: - Call Health Connect at  [phone] - they can help you locate a primary care doctor that  accepts your insurance, provides certain services, etc. - Physician Referral Service- [phone]  Chronic Pain Problems: Organization         Address  Phone   Notes  [facility]  [phone] Patients need to be  referred by their primary care doctor.   Medication Assistance: Organization         Address  Phone   Notes  [facility] Medication [facility] [address] [place], [place] [address] [phone] --Must be a resident of [facility] -- Must have NO insurance coverage whatsoever (no Medicaid/ Medicare, etc.) -- The pt. MUST have a primary care doctor that directs their care regularly and follows them in the community   MedAssist  [phone]    [employer]  [phone]    Agencies that provide inexpensive medical care: Organization         Address  Phone   Notes  [place]  [phone]   [facility]    [phone]   [facility] [place], [place] [address] [phone]   [facility] [address], [place] [phone]   Planned Parenthood    [phone]   [facility]    [phone]   [place] and [place] [address], [place] Phone:  [phone], Fax:  [phone] Hours of Operation:  9 am - 6 pm, M-F.  Also accepts Medicaid/Medicare and self-pay.  [facility] for [place], [address], [employer] Phone: [phone], Fax: [phone]. Hours of Operation:  8:30 am - 5:30 pm, M-F.  Also accepts Medicaid and self-pay.  [facility] [place] [address] [place] Phone: [phone]   [place], [place], [place] [phone], Ext. [phone] Mondays & Thursdays: 7-9 AM.  First 15 patients are seen on a first come, first serve basis.    [place] Providers:  Organization         Address  Phone   Notes  [facility] [address], [place] [phone] Also accepts self-pay patients.  [facility] [address], [place]  [phone]   [place], [address], [place] [phone]   [facility] [address], [place] [phone]   Lucianne Lei [address], [place]   [phone] Only accepts [place] Access [place] patients after they have their name applied to their card.   Self-Pay (no insurance) in [facility]:  Organization         Address  Phone   Notes  Sickle Cell Patients, [facility] [place] [phone]   [facility] [place] [phone]   [facility] [place]  [place], [place],  [phone]   [facility]/Dr. Osei-Bonsu  [address], [place] [address], [place] [phone] Phone number for both [place] and [place] locations is the same.  [facility] [address] [place] [phone]   [facility] [address] [place] or [address] [phone]   [facility] [address] [place] [phone], phone; [phone], fax Sees patients 1st and 3rd Saturday of every month.  Must not qualify for public or private insurance (i.e. Medicaid, Medicare, [place] Health Choice, Veterans' Benefits)  Household income should be no  more than 200% of the poverty level The clinic cannot treat you if you are pregnant or think you are pregnant  Sexually transmitted diseases are not treated at the clinic.    Dental Care: Organization         Address  Phone  Notes  [facility] Department of [facility] [phone] Accepts children up to age 20 who are enrolled in [place] or [place]; pregnant women with a Medicaid card; and children who have applied for Medicaid or [place] Health Choice, but were declined, whose parents can pay a reduced fee at time of service.  [facility] Department of [facility]  [address], [place] [phone] Accepts children up to age 20 who are enrolled in [place] or [place]; pregnant women with a Medicaid card; and children who have applied for Medicaid or [place] Health Choice, but were declined, whose parents can pay a reduced fee at time of service.  [place] Adult Dental Access PROGRAM  [place] [phone] Patients are seen by appointment only. Walk-ins are not accepted. [place] will see patients 40 years of age and older. Monday - Tuesday (8am-5pm) Most Wednesdays (8:30-5pm) $30 per visit, cash only  [facility] Adult Dental Access PROGRAM  [address], [facility] [phone] Patients are seen by appointment only. Walk-ins are not accepted. [place] will see patients 65 years of age and older. One Wednesday Evening (Monthly: Volunteer Based).  $30 per visit, cash only  [place]  [phone] for adults; Children under age 25, call Graduate Pediatric Dentistry at [phone]. Children aged 75-14, please call [phone] to request a pediatric application.  Dental services are provided in all areas of dental care including fillings, crowns and bridges, complete and partial dentures, implants, gum treatment, root canals, and extractions. Preventive care is also provided. Treatment is provided to both adults and children. Patients are selected via a lottery and there is often a waiting list.   [facility] [address] [place]  [phone] [email or web address]   [facility] [address] [place], [place] [phone], Ext. [phone] Second and Fourth Thursday of each month, opens at 6:30 AM; Clinic ends at 9 AM.  Patients are seen on a first-come first-served basis, and a limited number are seen during each clinic.   [facility]  [address] Hillard Danker [place], [place] [phone]   Eligibility Requirements You must have lived in [place], [place], or [place] counties for at least the last three months.   You cannot be eligible for state or federal sponsored [employer], including [employer], [place], or [employer].   You generally cannot be eligible for healthcare insurance through your employer.    How to apply: Eligibility screenings are held every Tuesday and Wednesday afternoon  from 1:00 pm until 4:00 pm. You do not need an appointment for the interview!  [facility] [address] [place], [place]   [place]  [place] Department  [place]  [phone]    Behavioral Health Resources in the Community: Intensive Outpatient Programs Organization         Address  Phone  Notes  [place]. [address], [place], [place]   [facility] [address] [place], [place]   ADS: [facility] Drug [facility] [address], [place], [place]   [place] [address]  [place], [place] or [phone]   Substance Abuse Resources Organization         Address  Phone  Notes  Alcohol and Drug Services  [phone]   [place]  [phone]   [facility]   Chinita Pester  [phone]   Residential & Outpatient Substance Abuse Program  [phone]   Psychological Services Organization         Address  Phone  Notes  [facility]  [place]  [phone]   [place] [address], [place] or [phone]    Mobile Crisis Teams Organization         Address  Phone  Notes  [facility], Mobile Crisis Care Unit  [phone]   [facility]  [address] [place], [place]   [place] [address], [place] [phone]    Self-Help/Support Groups Organization         Address  Phone             Notes  [place] of [place] - variety of support groups  [place] Call for more information  Narcotics Anonymous (NA), [facility] [address], [employer] [place]  2 meetings at this location   Materials engineer         Address  Phone  Notes  [facility] [place],    [place]  [phone]   [facility]  [address], [place] [address], [place], [place]   [place], [place] [phone] Admissions: 8am-3pm M-F  Incentives Substance [place] [address]    [place], [place] [phone]   [facility] [address] [place], [place], [place]   [facility] [address],  [place], [place]   [facility] - Intensive Outpatient [address], [place], [place]   [facility] [address]  [place], [place] [phone] or [phone]   [facility] ([facility]S) [address] [place], [place] Accepts Medicaid  [facility] [address],  [place] [phone] Substance Abuse/Addiction Treatment   [facility] Organization         Address  Phone  Notes  [facility]  [phone]   Domenic Schwab, PhD [address] Arlis Porta [place], [place]   [phone] or [phone]   [place], [place] [phone]   [facility] [address], [place], [place] [phone] Insurance/Medicaid/sponsorship through [facility] and Families [address]                                    [place], [place] [phone] [place] [address], [place] [phone]    Dr. Adele Schilder  [phone]   [facility] [place] Dept. 1) [address] [place] 2) [place] 3)  [phone]  [address], [place] [phone]  [phone]   [facility] Child Abuse Hotline 848-800-1241 or [phone] (After Hours)

## 2015-06-25 NOTE — ED Notes (Signed)
Patient reports waking with nausea this morning. Denies any vomiting, fevers, or urinary. Per patient possible spider bite to right leg. Bruising noted. Per patient tender to touch. No warmth or redness noted to area.

## 2015-06-25 NOTE — ED Provider Notes (Signed)
CSN: [id number]     Arrival date & time 06/25/15  0905 History   First MD Initiated Contact with Patient 06/25/15 [phone]     Chief Complaint  Patient presents with  . Nausea     (Consider location/radiation/quality/duration/timing/severity/associated sxs/prior Treatment) The history is provided by the patient and a parent. No language interpreter was used.  Mindy [name] is a 24 y.o female with a history of asthma who presents for nausea that began at 7 AM this morning. She is also complaining of a right mid lower leg possible spider bite. He denies seeing a spider or being aware of being bitten. She states she woke up this morning and noticed a bruise on her right leg. She denies taking anything prior to arrival. She states she has had 1 sexual partner in the last 6 months and that there is a change she could be pregnant.  She denies any fever, chills, chest pain, shortness of breath, abdominal pain, vomiting, diarrhea, constipation, dysuria, hematuria, urinary frequency, vaginal discharge or bleeding. Her last menstrual period was 2 weeks ago. She hasn't Implanon in her left arm which she states should have been taken out 6 months ago but cannot afford this. He denies any injury or fall that may have caused the right leg wound. She denies any alcohol or drug use. She states she is a vegan.  Past Medical History  Diagnosis Date  . Asthma     exercise induced- no meds  . UTI (lower urinary tract infection)    Past Surgical History  Procedure Laterality Date  . No past surgeries     Family History  Problem Relation Age of Onset  . Cancer Maternal Aunt     lung cancer  . Heart disease Other     CAD  . Diabetes Other    History  Substance Use Topics  . Smoking status: Former Smoker -- 0.50 packs/day for 3 years    Types: Cigarettes    Quit date: 11/27/2010  . Smokeless tobacco: Never Used  . Alcohol Use: No   OB History    Gravida Para Term Preterm AB TAB SAB Ectopic Multiple Living    1 1 1       1      Review of Systems  Constitutional: Negative for fever.  Gastrointestinal: Negative for abdominal pain.  Genitourinary: Negative for vaginal bleeding, vaginal discharge, vaginal pain and pelvic pain.  All other systems reviewed and are negative.     Allergies  Ibuprofen  Home Medications   Prior to Admission medications   Medication Sig Start Date End Date Taking? Authorizing Provider  Aspirin-Salicylamide-Caffeine (BC HEADACHE) 325-95-16 MG TABS Take 1 packet by mouth daily as needed (for pain).   Yes Historical Provider, MD  etonogestrel (IMPLANON) 68 MG IMPL implant 1 each by Subdermal route once.   Yes Historical Provider, MD  ondansetron (ZOFRAN) 4 MG tablet Take 1 tablet (4 mg total) by mouth every 6 (six) hours. 06/25/15   Airlie Blumenberg Patel-Mills, PA-C   BP 105/76 mmHg  Pulse 86  Temp(Src) 98.1 F (36.7 C) (Oral)  Resp 18  Ht 5\' 5"  (1.651 m)  Wt 138 lb (62.596 kg)  BMI 22.96 kg/m2  SpO2 98%  LMP 06/03/2015 Physical Exam  Constitutional: She is oriented to person, place, and time. She appears well-developed and well-nourished.  HENT:  Head: Normocephalic and atraumatic.  Eyes: Conjunctivae are normal.  Neck: Normal range of motion. Neck supple.  Cardiovascular: Normal rate, regular rhythm and normal  heart sounds.   Pulmonary/Chest: Effort normal and breath sounds normal. No respiratory distress. She has no wheezes. She has no rales.  Abdominal: Soft. She exhibits no distension and no mass. There is no tenderness. There is no rigidity, no rebound, no guarding and no CVA tenderness.  Musculoskeletal: Normal range of motion.  Mild eccymosis to the right lower mid leg without surrounding erythema or edema. No signs of infection or drainage. Puncture wound seen.  Neurological: She is alert and oriented to person, place, and time.  Skin: Skin is warm and dry.  Psychiatric: She has a normal mood and affect. Her behavior is normal.  Nursing note and vitals  reviewed.   ED Course  Procedures (including critical care time) Labs Review Labs Reviewed  URINALYSIS, ROUTINE W REFLEX MICROSCOPIC (NOT AT [facility]) - Abnormal; Notable for the following:    APPearance HAZY (*)    Specific Gravity, Urine <1.005 (*)    Hgb urine dipstick TRACE (*)    All other components within normal limits  URINE MICROSCOPIC-ADD ON - Abnormal; Notable for the following:    Squamous Epithelial / LPF MANY (*)    Bacteria, UA FEW (*)    All other components within normal limits  PREGNANCY, URINE  CBC WITH DIFFERENTIAL/PLATELET  BASIC METABOLIC PANEL    Imaging Review No results found.   EKG Interpretation None      MDM   Final diagnoses:  Nausea  Contusion of right lower leg, initial encounter   Patient presents for possible spider bite and nausea. She states she could be pregnant. Patient has no abdominal pain or tenderness. No peritoneal signs. Her vitals are stable. She is resting comfortably and on her cell phone. Her labs are unremarkable and she has no UTI. She is not pregnant. Tolerating by mouth fluids. No vomiting or diarrhea while in the ED. I discussed return precautions with her. She was given an ice pack for her right leg. I do not see any concerning signs that would suggest a spider bite and her platelets are normal. She can follow up using the resource guide and I have given her Zofran for nausea. Patient verbally agrees with the plan. Medications  ondansetron (ZOFRAN-ODT) disintegrating tablet 4 mg (4 mg Oral Given 06/25/15 0955)      Ottie Glazier, PA-C 06/25/15 1111  Milton Ferguson, MD 06/25/15 1350

## 2015-06-25 NOTE — ED Notes (Signed)
PA at bedside.

## 2015-08-18 ENCOUNTER — Emergency Department (HOSPITAL_COMMUNITY)
Admission: EM | Admit: 2015-08-18 | Discharge: 2015-08-18 | Disposition: A | Discharge status: Home / Self Care | Payer: Self-pay | Attending: Emergency Medicine | Admitting: Emergency Medicine

## 2015-08-18 ENCOUNTER — Emergency Department (HOSPITAL_COMMUNITY): Payer: Self-pay

## 2015-08-18 ENCOUNTER — Encounter (HOSPITAL_COMMUNITY): Payer: Self-pay | Admitting: Emergency Medicine

## 2015-08-18 DIAGNOSIS — J45909 Unspecified asthma, uncomplicated: Secondary | ICD-10-CM | POA: Insufficient documentation

## 2015-08-18 DIAGNOSIS — R102 Pelvic and perineal pain: Secondary | ICD-10-CM

## 2015-08-18 DIAGNOSIS — Z3202 Encounter for pregnancy test, result negative: Secondary | ICD-10-CM | POA: Insufficient documentation

## 2015-08-18 DIAGNOSIS — Z7982 Long term (current) use of aspirin: Secondary | ICD-10-CM | POA: Insufficient documentation

## 2015-08-18 DIAGNOSIS — Z87891 Personal history of nicotine dependence: Secondary | ICD-10-CM | POA: Insufficient documentation

## 2015-08-18 DIAGNOSIS — R1030 Lower abdominal pain, unspecified: Secondary | ICD-10-CM | POA: Insufficient documentation

## 2015-08-18 DIAGNOSIS — R1032 Left lower quadrant pain: Secondary | ICD-10-CM

## 2015-08-18 DIAGNOSIS — R1031 Right lower quadrant pain: Secondary | ICD-10-CM

## 2015-08-18 DIAGNOSIS — Z87440 Personal history of urinary (tract) infections: Secondary | ICD-10-CM | POA: Insufficient documentation

## 2015-08-18 LAB — URINALYSIS, ROUTINE W REFLEX MICROSCOPIC
Bilirubin Urine: NEGATIVE
Glucose, UA: NEGATIVE mg/dL
Hgb urine dipstick: NEGATIVE
Ketones, ur: NEGATIVE mg/dL
Leukocytes, UA: NEGATIVE
NITRITE: NEGATIVE
Protein, ur: NEGATIVE mg/dL
Specific Gravity, Urine: 1.017 (ref 1.005–1.030)
Urobilinogen, UA: 0.2 mg/dL (ref 0.0–1.0)
pH: 5.5 (ref 5.0–8.0)

## 2015-08-18 LAB — WET PREP, GENITAL
Clue Cells Wet Prep HPF POC: NONE SEEN
Trich, Wet Prep: NONE SEEN
Yeast Wet Prep HPF POC: NONE SEEN

## 2015-08-18 LAB — COMPREHENSIVE METABOLIC PANEL
ALT: 11 U/L — ABNORMAL LOW (ref 14–54)
AST: 15 U/L (ref 15–41)
Albumin: 4.7 g/dL (ref 3.5–5.0)
Alkaline Phosphatase: 42 U/L (ref 38–126)
Anion gap: 7 (ref 5–15)
BILIRUBIN TOTAL: 0.8 mg/dL (ref 0.3–1.2)
BUN: 7 mg/dL (ref 6–20)
CO2: 26 mmol/L (ref 22–32)
Calcium: 10 mg/dL (ref 8.9–10.3)
Chloride: 105 mmol/L (ref 101–111)
Creatinine, Ser: 0.76 mg/dL (ref 0.44–1.00)
GFR calc Af Amer: >60 mL/min (ref >60)
GFR calc non Af Amer: >60 mL/min (ref >60)
Glucose, Bld: 97 mg/dL (ref 65–99)
Potassium: 3.7 mmol/L (ref 3.5–5.1)
Sodium: 138 mmol/L (ref 135–145)
Total Protein: 8 g/dL (ref 6.5–8.1)

## 2015-08-18 LAB — CBC
HCT: 40 % (ref 36.0–46.0)
Hemoglobin: 13.5 g/dL (ref 12.0–15.0)
MCH: 30.3 pg (ref 26.0–34.0)
MCHC: 33.8 g/dL (ref 30.0–36.0)
MCV: 89.7 fL (ref 78.0–100.0)
PLATELETS: 269 10*3/uL (ref 150–400)
RBC: 4.46 MIL/uL (ref 3.87–5.11)
RDW: 12.9 % (ref 11.5–15.5)
WBC: 8.9 10*3/uL (ref 4.0–10.5)

## 2015-08-18 LAB — I-STAT BETA HCG BLOOD, ED (MC, WL, AP ONLY): I-stat hCG, quantitative: <5 m[IU]/mL (ref <5)

## 2015-08-18 MED ORDER — ONDANSETRON HCL 4 MG/2ML IJ SOLN
4.0000 mg | Freq: Once | INTRAMUSCULAR | Status: AC
  Administered 2015-08-18: 4 mg via INTRAVENOUS
  Filled 2015-08-18: qty 2

## 2015-08-18 MED ORDER — IOHEXOL 300 MG/ML  SOLN
100.0000 mL | Freq: Once | INTRAMUSCULAR | Status: AC | PRN
  Administered 2015-08-18: 100 mL via INTRAVENOUS

## 2015-08-18 MED ORDER — DOXYCYCLINE HYCLATE 100 MG PO CAPS: 100.0000 mg | ORAL_CAPSULE | Freq: Two times a day (BID) | ORAL | Status: DC

## 2015-08-18 MED ORDER — LIDOCAINE HCL (PF) 1 % IJ SOLN
2.0000 mL | Freq: Once | INTRAMUSCULAR | Status: AC
  Administered 2015-08-18: 2 mL
  Filled 2015-08-18: qty 5

## 2015-08-18 MED ORDER — FENTANYL CITRATE (PF) 100 MCG/2ML IJ SOLN
50.0000 ug | Freq: Once | INTRAMUSCULAR | Status: AC
  Administered 2015-08-18: 50 ug via INTRAVENOUS
  Filled 2015-08-18: qty 2

## 2015-08-18 MED ORDER — AZITHROMYCIN 250 MG PO TABS
1000.0000 mg | ORAL_TABLET | Freq: Once | ORAL | Status: AC
  Administered 2015-08-18: 1000 mg via ORAL
  Filled 2015-08-18: qty 4

## 2015-08-18 MED ORDER — CEFTRIAXONE SODIUM 250 MG IJ SOLR
250.0000 mg | Freq: Once | INTRAMUSCULAR | Status: AC
  Administered 2015-08-18: 250 mg via INTRAMUSCULAR
  Filled 2015-08-18: qty 250

## 2015-08-18 MED ORDER — ONDANSETRON HCL 4 MG/2ML IJ SOLN: 4.0000 mg | Freq: Once | INTRAMUSCULAR | Status: DC | PRN

## 2015-08-18 NOTE — Discharge Instructions (Signed)
You were evaluated in the ED today for abdominal discomfort. You will be treated for PID. Please take your antibiotics as prescribed. Your labs were reassuring. Your CT of your abdomen showed no evidence of appendicitis. Your ultrasound also was reassuring and showed no other ovarian cysts or anatomical abnormalities. Please follow-up with your doctor as needed for reevaluation. Return to ED for new or worsening symptoms.

## 2015-08-18 NOTE — ED Provider Notes (Signed)
CSN: [id number]     Arrival date & time 08/18/15  1244 History   First MD Initiated Contact with Patient 08/18/15 1343     Chief Complaint  Patient presents with  . Abdominal Pain  . Bloated     (Consider location/radiation/quality/duration/timing/severity/associated sxs/prior Treatment) HPI Mindy [name] is a 24 y.o. female who comes in for evaluation of abdominal pain. Patient states last night approximately 10:30 PM she began to have sharp stabbing periumbilical and right lower quadrant pain, worse with movements. She reports sitting in class today and lifting her knee which exacerbated her pain. She reports associated nausea without vomiting. No fevers, chills. She also reports some dysuria and pressure sensation. She does report at approximately 11:30 AM this morning eating an aspirin and Aleve with "one bite of pizza and one sip of Dr. Malachi Bonds". Rates her overall discomfort as a 6/10. Last menstrual period was August 26.  Past Medical History  Diagnosis Date  . Asthma     exercise induced- no meds  . UTI (lower urinary tract infection)    Past Surgical History  Procedure Laterality Date  . No past surgeries     Family History  Problem Relation Age of Onset  . Cancer Maternal Aunt     lung cancer  . Heart disease Other     CAD  . Diabetes Other    Social History  Substance Use Topics  . Smoking status: Former Smoker -- 0.50 packs/day for 3 years    Types: Cigarettes    Quit date: 11/27/2010  . Smokeless tobacco: Never Used  . Alcohol Use: No   OB History    Gravida Para Term Preterm AB TAB SAB Ectopic Multiple Living   1 1 1       1      Review of Systems A 10 point review of systems was completed and was negative except for pertinent positives and negatives as mentioned in the history of present illness     Allergies  Ibuprofen  Home Medications   Prior to Admission medications   Medication Sig Start Date End Date Taking? Authorizing Provider  aspirin  325 MG tablet Take 325 mg by mouth daily as needed for mild pain or moderate pain.   Yes Historical Provider, MD  Aspirin-Salicylamide-Caffeine (BC HEADACHE) 325-95-16 MG TABS Take 1 packet by mouth daily as needed (for pain).   Yes Historical Provider, MD  etonogestrel (IMPLANON) 68 MG IMPL implant 1 each by Subdermal route once.   Yes Historical Provider, MD  doxycycline (VIBRAMYCIN) 100 MG capsule Take 1 capsule (100 mg total) by mouth 2 (two) times daily. One po bid x 7 days 08/18/15   Comer Locket, PA-C  ondansetron (ZOFRAN) 4 MG tablet Take 1 tablet (4 mg total) by mouth every 6 (six) hours. Patient not taking: Reported on 08/18/2015 06/25/15   Hanna Patel-Mills, PA-C   BP 126/72 mmHg  Pulse 80  Temp(Src) 98.4 F (36.9 C) (Oral)  Resp 17  Ht 5\' 5"  (1.651 m)  Wt 135 lb (61.236 kg)  BMI 22.47 kg/m2  SpO2 98%  LMP 07/29/2015 (Approximate) Physical Exam  Constitutional: She is oriented to person, place, and time. She appears well-developed and well-nourished.  HENT:  Head: Normocephalic and atraumatic.  Mouth/Throat: Oropharynx is clear and moist.  Eyes: Conjunctivae are normal. Pupils are equal, round, and reactive to light. Right eye exhibits no discharge. Left eye exhibits no discharge. No scleral icterus.  Neck: Neck supple.  Cardiovascular: Normal rate, regular  rhythm and normal heart sounds.   Pulmonary/Chest: Effort normal and breath sounds normal. No respiratory distress. She has no wheezes. She has no rales.  Abdominal: Soft.  Abdomen is soft. Positive Rovsing's and McBurney's. No other lesions or deformities.  Genitourinary:  Chaperone was present for the entire genital exam. No lesions or rashes appreciated on vulva. Cervix visualized on speculum exam and appropriate cultures sampled. Scant blood in vaginal vault. Discharge: None appreciated Upon bi manual exam- moderate cervical motion tenderness without any tenderness of adnexa. No fullness or masses appreciated. No  abnormalities appreciated in structural anatomy.   Musculoskeletal: She exhibits no tenderness.  Neurological: She is alert and oriented to person, place, and time.  Cranial Nerves II-XII grossly intact  Skin: Skin is warm and dry. No rash noted.  Psychiatric: She has a normal mood and affect.  Nursing note and vitals reviewed.   ED Course  Procedures (including critical care time) Labs Review Labs Reviewed  WET PREP, GENITAL - Abnormal; Notable for the following:    WBC, Wet Prep HPF POC MODERATE (*)    All other components within normal limits  COMPREHENSIVE METABOLIC PANEL - Abnormal; Notable for the following:    ALT 11 (*)    All other components within normal limits  URINALYSIS, ROUTINE W REFLEX MICROSCOPIC (NOT AT [facility]) - Abnormal; Notable for the following:    APPearance HAZY (*)    All other components within normal limits  CBC  HIV ANTIBODY (ROUTINE TESTING)  RPR  I-STAT BETA HCG BLOOD, ED ([facility], [facility], [facility] ONLY)  GC/CHLAMYDIA PROBE AMP ([facility]) NOT AT [facility]    Imaging Review US Transvaginal Non-ob  08/18/2015   CLINICAL DATA:  Pelvic pain  EXAM: TRANSABDOMINAL AND TRANSVAGINAL ULTRASOUND OF PELVIS  TECHNIQUE: Study was performed transabdominally to optimize pelvic field of view evaluation and transvaginally to optimize internal visceral architecture evaluation.  COMPARISON:  CT abdomen and pelvis August 18, 2015  FINDINGS: Uterus  Measurements: 8.8 x 4.2 x 4.5 cm. No fibroids or other mass visualized.  Endometrium  Thickness: 3 mm.  No focal abnormality visualized.  Right ovary  Measurements: 3.1 x 2.3 x 2.8 cm. Normal appearance/no adnexal mass.  Left ovary  Measurements: 2.9 x 1.9 x 3.2 cm. Normal appearance/no adnexal mass.  Other findings  There is a fairly large amount of free pelvic fluid. There is minimal debris within this fluid. There are loops of peristalsing bowel within this fluid.  IMPRESSION: Fairly large amount of free pelvic fluid as noted on CT earlier in  the day. There is minimal debris in this fluid. Suspect ovarian cyst rupture. Pelvic inflammatory disease is a left likely differential consideration given absence of ovarian/adnexal pathology and the relatively simple nature of the free fluid present. Study otherwise unremarkable.   Electronically Signed   By: Lowella Grip III M.D.   On: 08/18/2015 19:03   US Pelvis Complete  08/18/2015   CLINICAL DATA:  Pelvic pain  EXAM: TRANSABDOMINAL AND TRANSVAGINAL ULTRASOUND OF PELVIS  TECHNIQUE: Study was performed transabdominally to optimize pelvic field of view evaluation and transvaginally to optimize internal visceral architecture evaluation.  COMPARISON:  CT abdomen and pelvis August 18, 2015  FINDINGS: Uterus  Measurements: 8.8 x 4.2 x 4.5 cm. No fibroids or other mass visualized.  Endometrium  Thickness: 3 mm.  No focal abnormality visualized.  Right ovary  Measurements: 3.1 x 2.3 x 2.8 cm. Normal appearance/no adnexal mass.  Left ovary  Measurements: 2.9 x 1.9 x 3.2  cm. Normal appearance/no adnexal mass.  Other findings  There is a fairly large amount of free pelvic fluid. There is minimal debris within this fluid. There are loops of peristalsing bowel within this fluid.  IMPRESSION: Fairly large amount of free pelvic fluid as noted on CT earlier in the day. There is minimal debris in this fluid. Suspect ovarian cyst rupture. Pelvic inflammatory disease is a left likely differential consideration given absence of ovarian/adnexal pathology and the relatively simple nature of the free fluid present. Study otherwise unremarkable.   Electronically Signed   By: Lowella Grip III M.D.   On: 08/18/2015 19:03   Ct Abdomen Pelvis W Contrast  08/18/2015   CLINICAL DATA:  Right lower quadrant abdominal pain since last night.  EXAM: CT ABDOMEN AND PELVIS WITH CONTRAST  TECHNIQUE: Multidetector CT imaging of the abdomen and pelvis was performed using the standard protocol following bolus administration of  intravenous contrast.  CONTRAST:  166mL OMNIPAQUE IOHEXOL 300 MG/ML  SOLN  COMPARISON:  None.  FINDINGS: Lung bases are clear.  No effusions.  Heart is normal size.  Liver, gallbladder, spleen, pancreas, adrenals and kidneys are normal.  Appendix is visualized and is normal. Stomach, large and small bowel unremarkable.  There is a large amount of complex appearing free fluid in the pelvis. Collapsing 2 cm right ovarian cyst or follicle. No free air or adenopathy. No acute bony abnormality.  IMPRESSION: Large volume of complex free fluid in the pelvis. Differential considerations would include ruptured hemorrhagic ovarian cyst or pelvic inflammatory disease.  Appendix is normal.   Electronically Signed   By: Rolm Baptise M.D.   On: 08/18/2015 16:09   I have personally reviewed and evaluated these images and lab results as part of my medical decision-making.   EKG Interpretation None     Meds given in ED:  Medications  ondansetron (ZOFRAN) injection 4 mg (not administered)  ondansetron (ZOFRAN) injection 4 mg (4 mg Intravenous Given 08/18/15 1440)  fentaNYL (SUBLIMAZE) injection 50 mcg (50 mcg Intravenous Given 08/18/15 1441)  iohexol (OMNIPAQUE) 300 MG/ML solution 100 mL (100 mLs Intravenous Contrast Given 08/18/15 1543)  cefTRIAXone (ROCEPHIN) injection 250 mg (250 mg Intramuscular Given 08/18/15 1722)  azithromycin (ZITHROMAX) tablet 1,000 mg (1,000 mg Oral Given 08/18/15 1722)  lidocaine (PF) (XYLOCAINE) 1 % injection 2 mL (2 mLs Other Given 08/18/15 1722)  fentaNYL (SUBLIMAZE) injection 50 mcg (50 mcg Intravenous Given 08/18/15 1854)    Discharge Medication List as of 08/18/2015  7:48 PM    START taking these medications   Details  doxycycline (VIBRAMYCIN) 100 MG capsule Take 1 capsule (100 mg total) by mouth 2 (two) times daily. One po bid x 7 days, Starting 08/18/2015, Until Discontinued, Print       Filed Vitals:   08/18/15 1259 08/18/15 1356 08/18/15 1955  BP: 117/79 126/72 126/72   Pulse: 79 74 80  Temp: 98.4 F (36.9 C)    TempSrc: Oral    Resp: 16 15 17   Height: 5\' 5"  (1.651 m)    Weight: 135 lb (61.236 kg)    SpO2: 100% 97% 98%    MDM  Vitals stable - WNL -afebrile Pt resting comfortably in ED. PE--physical exam as above. Patient with diffuse abdominal discomfort in lower abdomen. Moderate cervical motion tenderness on pelvic exam. Labwork-moderate wbc's on wet prep, labs are otherwise noncontributory. Negative pregnancy. Imaging-CT abdomen shows fairly large amount of free pelvic fluid, differential includes ruptured hemorrhagic ovarian cyst or pelvic inflammatory disease. Specifically,  the appendix is normal. Transvaginal ultrasound obtained, which confirms free pelvic fluid but no other ovarian or adnexal pathology.  DDX--will treat empirically for PID. Discussed follow-up with PCP in one week for reevaluation as needed Given azithromycin, ceftriaxone in the ED, discharged with doxycycline. I discussed all relevant lab findings and imaging results with pt and they verbalized understanding. Discussed f/u with PCP within 48 hrs and return precautions, pt very amenable to plan. Prior to patient discharge, I discussed and reviewed this case with Dr. Alvino Chapel Patient stable, in good condition and appropriate for discharge.   Final diagnoses:  Bilateral lower abdominal discomfort        Comer Locket, PA-C 08/18/15 2020  Davonna Belling, MD 08/19/15 [phone]

## 2015-08-18 NOTE — ED Notes (Signed)
Pt reports sudden onset of RLQ abdominal pain last night. PT reports she was doubled over in pain today in class. Pt awake, alert, oriented x4, NAD at present.

## 2015-08-19 LAB — HIV ANTIBODY (ROUTINE TESTING W REFLEX): HIV Screen 4th Generation wRfx: NONREACTIVE

## 2015-08-19 LAB — GC/CHLAMYDIA PROBE AMP (~~LOC~~) NOT AT ARMC
CHLAMYDIA, DNA PROBE: NEGATIVE
Neisseria Gonorrhea: NEGATIVE

## 2015-08-19 LAB — RPR: RPR: NONREACTIVE

## 2016-03-30 ENCOUNTER — Encounter: Payer: Self-pay | Admitting: Obstetrics and Gynecology

## 2016-03-30 ENCOUNTER — Ambulatory Visit (INDEPENDENT_AMBULATORY_CARE_PROVIDER_SITE_OTHER): Payer: Self-pay | Admitting: Obstetrics and Gynecology

## 2016-03-30 VITALS — BP 100/60 | Ht 65.0 in | Wt 133.0 lb

## 2016-03-30 DIAGNOSIS — Z3046 Encounter for surveillance of implantable subdermal contraceptive: Secondary | ICD-10-CM

## 2016-03-30 MED ORDER — NORGESTIMATE-ETH ESTRADIOL 0.25-35 MG-MCG PO TABS: 1.0000 | ORAL_TABLET | Freq: Every day | ORAL | Status: DC

## 2016-03-30 NOTE — Progress Notes (Signed)
Patient ID: Mindy [name], female   DOB: 1991-02-07, 25 y.o.   MRN: [id number] Pt here today for nexplanon removal.

## 2016-03-30 NOTE — Progress Notes (Signed)
Patient ID: Mindy [name], female   DOB: 1991/08/02, 25 y.o.   MRN: [id number]  [place] PROCEDURE NOTE  [name] is a 25 y.o. G1P1001 here for Nexplanon removal. Pt reports her Nexplanon "expired" a few months ago and since it "expired" she had a cyst rupture and irregular periods (with period starting 7 days early or 7 days late). Pt also reports she experienced facial breakouts after the Nexplanon insertion. Pt reports she cannot get a new Nexplanon inserted due to lack of insurance coverage.  No other gynecologic concerns.  Nexplanon Removal Patient identified, informed consent performed, consent signed.   Appropriate time out taken. Nexplanon site identified.  Area prepped in usual sterile fashon. One ml of 1% lidocaine was used to anesthetize the area at the distal end of the implant. A small stab incision was made right beside the implant on the distal portion.  The Nexplanon rod was grasped using hemostats and removed without difficulty.  There was minimal blood loss. There were no complications.  3 ml of 1% lidocaine was injected around the incision for post-procedure analgesia.  Steri-strips were applied over the small incision.  A pressure bandage was applied to reduce any bruising.  The patient tolerated the procedure well and was given post procedure instructions.  Patient is planning to use ocp  for contraception/attempt conception.  By signing my name below, I, Terressa Koyanagi, attest that this documentation has been prepared under the direction and in the presence of Mallory Shirk, MD. Electronically Signed: Terressa Koyanagi, ED Scribe. 03/30/2016. 12:36 PM.   I personally performed the services described in this documentation, which was SCRIBED in my presence. The recorded information has been reviewed and considered accurate. It has been edited as necessary during review. Jonnie Kind, MD

## 2016-12-30 ENCOUNTER — Emergency Department (HOSPITAL_COMMUNITY): Payer: Self-pay

## 2016-12-30 ENCOUNTER — Emergency Department (HOSPITAL_COMMUNITY)
Admission: EM | Admit: 2016-12-30 | Discharge: 2016-12-30 | Disposition: A | Discharge status: Home / Self Care | Payer: Self-pay | Attending: Emergency Medicine | Admitting: Emergency Medicine

## 2016-12-30 ENCOUNTER — Encounter (HOSPITAL_COMMUNITY): Payer: Self-pay | Admitting: Emergency Medicine

## 2016-12-30 DIAGNOSIS — J45909 Unspecified asthma, uncomplicated: Secondary | ICD-10-CM | POA: Insufficient documentation

## 2016-12-30 DIAGNOSIS — N9489 Other specified conditions associated with female genital organs and menstrual cycle: Secondary | ICD-10-CM | POA: Insufficient documentation

## 2016-12-30 DIAGNOSIS — R52 Pain, unspecified: Secondary | ICD-10-CM

## 2016-12-30 DIAGNOSIS — Z87891 Personal history of nicotine dependence: Secondary | ICD-10-CM | POA: Insufficient documentation

## 2016-12-30 DIAGNOSIS — D259 Leiomyoma of uterus, unspecified: Secondary | ICD-10-CM | POA: Insufficient documentation

## 2016-12-30 DIAGNOSIS — R102 Pelvic and perineal pain: Secondary | ICD-10-CM | POA: Insufficient documentation

## 2016-12-30 DIAGNOSIS — Z7982 Long term (current) use of aspirin: Secondary | ICD-10-CM | POA: Insufficient documentation

## 2016-12-30 LAB — COMPREHENSIVE METABOLIC PANEL
ALK PHOS: 31 U/L — AB (ref 38–126)
ALT: 6 U/L — ABNORMAL LOW (ref 14–54)
ANION GAP: 9 (ref 5–15)
AST: 15 U/L (ref 15–41)
Albumin: 3.6 g/dL (ref 3.5–5.0)
BILIRUBIN TOTAL: 0.4 mg/dL (ref 0.3–1.2)
BUN: 8 mg/dL (ref 6–20)
CALCIUM: 9 mg/dL (ref 8.9–10.3)
CO2: 25 mmol/L (ref 22–32)
Chloride: 106 mmol/L (ref 101–111)
Creatinine, Ser: 0.72 mg/dL (ref 0.44–1.00)
Glucose, Bld: 92 mg/dL (ref 65–99)
POTASSIUM: 4.1 mmol/L (ref 3.5–5.1)
Sodium: 140 mmol/L (ref 135–145)
TOTAL PROTEIN: 6.7 g/dL (ref 6.5–8.1)

## 2016-12-30 LAB — URINALYSIS, ROUTINE W REFLEX MICROSCOPIC
BILIRUBIN URINE: NEGATIVE
GLUCOSE, UA: NEGATIVE mg/dL
HGB URINE DIPSTICK: NEGATIVE
Ketones, ur: NEGATIVE mg/dL
NITRITE: NEGATIVE
PROTEIN: NEGATIVE mg/dL
Specific Gravity, Urine: 1.023 (ref 1.005–1.030)
pH: 7 (ref 5.0–8.0)

## 2016-12-30 LAB — CBC
HEMATOCRIT: 41.1 % (ref 36.0–46.0)
HEMOGLOBIN: 13.6 g/dL (ref 12.0–15.0)
MCH: 30.2 pg (ref 26.0–34.0)
MCHC: 33.1 g/dL (ref 30.0–36.0)
MCV: 91.1 fL (ref 78.0–100.0)
Platelets: 272 10*3/uL (ref 150–400)
RBC: 4.51 MIL/uL (ref 3.87–5.11)
RDW: 13.9 % (ref 11.5–15.5)
WBC: 8.7 10*3/uL (ref 4.0–10.5)

## 2016-12-30 LAB — LIPASE, BLOOD: Lipase: 22 U/L (ref 11–51)

## 2016-12-30 LAB — I-STAT BETA HCG BLOOD, ED (MC, WL, AP ONLY): I-stat hCG, quantitative: <5 m[IU]/mL (ref <5)

## 2016-12-30 MED ORDER — NAPROXEN 500 MG PO TABS: 500.0000 mg | ORAL_TABLET | Freq: Two times a day (BID) | ORAL | 0 refills | Status: DC

## 2016-12-30 MED ORDER — KETOROLAC TROMETHAMINE 30 MG/ML IJ SOLN
30.0000 mg | Freq: Once | INTRAMUSCULAR | Status: AC
  Administered 2016-12-30: 30 mg via INTRAVENOUS
  Filled 2016-12-30: qty 1

## 2016-12-30 MED ORDER — IOPAMIDOL (ISOVUE-300) INJECTION 61%
INTRAVENOUS | Status: AC
  Administered 2016-12-30: 100 mL
  Filled 2016-12-30: qty 100

## 2016-12-30 MED ORDER — NAPROXEN 250 MG PO TABS
500.0000 mg | ORAL_TABLET | Freq: Once | ORAL | Status: AC
  Administered 2016-12-30: 500 mg via ORAL
  Filled 2016-12-30: qty 2

## 2016-12-30 NOTE — Discharge Instructions (Signed)
Naproxen as needed for pain  Call Dr. Glo Herring for follow-up appointment.

## 2016-12-30 NOTE — ED Provider Notes (Addendum)
Patient presents for evaluation of right lower quadrant pain. She has some tenderness on exam but deathly no peritoneal irritation. Just ended menses yesterday, not midcycle. No nausea or vomiting. Not pregnant. No leukocytosis. Agree with imaging. Differential does include appendicitis. However, this is unlikely with minimal exam and no leukocytosis after 3 days. May be ovarian cyst. Is post menstrual not midcycle. We'll reevaluate after CT imaging.  21:32:  CT shows signs of pelvic congestion with dilated veins. Possible varix between uterus and ovary and ultrasound recommended. This was discussed at length with patient. She is going for pelvic ultrasound.   22:38:     Tanna Furry, MD 12/30/16 2002    Tanna Furry, MD : 12/30/16 2133    Tanna Furry, MD 12/30/16 2239

## 2016-12-30 NOTE — ED Triage Notes (Signed)
Pt here for RLQ pain x 3 days; pt sts similar to when had ovarian cyst rupture

## 2016-12-30 NOTE — ED Provider Notes (Signed)
[facility] DEPT Provider Note  CSN: [id number] Arrival date & time: 12/30/16  1655  History   Chief Complaint Chief Complaint  Patient presents with  . Abdominal Pain   HPI Camara R Huckeba is a 26 y.o. female.  The history is provided by the patient and medical records. No language interpreter was used.  Illness  This is a new problem. The current episode started more than 2 days ago. The problem occurs constantly. The problem has been gradually worsening. Associated symptoms include abdominal pain. Pertinent negatives include no chest pain, no headaches and no shortness of breath. Nothing aggravates the symptoms. Nothing relieves the symptoms.    Past Medical History:  Diagnosis Date  . Asthma    exercise induced- no meds  . UTI (lower urinary tract infection)    Patient Active Problem List   Diagnosis Date Noted  . Acne vulgaris 03/31/2013   Past Surgical History:  Procedure Laterality Date  . NO PAST SURGERIES     OB History    Gravida Para Term Preterm AB Living   1 1 1     1    SAB TAB Ectopic Multiple Live Births           1      Home Medications    Prior to Admission medications   Medication Sig Start Date End Date Taking? Authorizing Provider  aspirin 325 MG tablet Take 325 mg by mouth daily as needed for mild pain or moderate pain.    Historical Provider, MD  Aspirin-Salicylamide-Caffeine (BC HEADACHE) 325-95-16 MG TABS Take 1 packet by mouth daily as needed (for pain).    Historical Provider, MD  naproxen (NAPROSYN) 500 MG tablet Take 1 tablet (500 mg total) by mouth 2 (two) times daily. 12/30/16   Tanna Furry, MD  norgestimate-ethinyl estradiol (ORTHO-CYCLEN,SPRINTEC,PREVIFEM) 0.25-35 MG-MCG tablet Take 1 tablet by mouth daily. 03/30/16   Jonnie Kind, MD   Family History Family History  Problem Relation Age of Onset  . Cancer Maternal Aunt     lung cancer  . Heart disease Other     CAD  . Diabetes Other   . Heart murmur Son    Social  History Social History  Substance Use Topics  . Smoking status: Former Smoker    Packs/day: 0.50    Years: 3.00    Types: Cigarettes, E-cigarettes    Quit date: 11/27/2010  . Smokeless tobacco: Never Used     Comment: vape  . Alcohol use Yes     Comment: occ    Allergies   Ibuprofen  Review of Systems Review of Systems  Respiratory: Negative for shortness of breath.   Cardiovascular: Negative for chest pain.  Gastrointestinal: Positive for abdominal pain and nausea.  Neurological: Negative for headaches.  All other systems reviewed and are negative.   Physical Exam Updated Vital Signs BP 108/88   Pulse (!) 52   Temp 99.3 F (37.4 C) (Oral)   Resp 18   SpO2 98%   Physical Exam  Constitutional: She is oriented to person, place, and time. She appears well-developed and well-nourished. No distress.  HENT:  Head: Normocephalic and atraumatic.  Eyes: EOM are normal. Pupils are equal, round, and reactive to light.  Neck: Normal range of motion. Neck supple.  Cardiovascular: Normal rate, regular rhythm and normal heart sounds.   Pulmonary/Chest: Effort normal and breath sounds normal.  Abdominal: Soft. Bowel sounds are normal. She exhibits no distension. There is tenderness (RLQ).  Musculoskeletal: Normal range  of motion.  Neurological: She is alert and oriented to person, place, and time.  Skin: Skin is warm and dry. Capillary refill takes less than 2 seconds. She is not diaphoretic.  Nursing note and vitals reviewed.   ED Treatments / Results  Labs (all labs ordered are listed, but only abnormal results are displayed) Labs Reviewed  COMPREHENSIVE METABOLIC PANEL - Abnormal; Notable for the following:       Result Value   ALT 6 (*)    Alkaline Phosphatase 31 (*)    All other components within normal limits  URINALYSIS, ROUTINE W REFLEX MICROSCOPIC - Abnormal; Notable for the following:    APPearance HAZY (*)    Leukocytes, UA SMALL (*)    Bacteria, UA MANY (*)     Squamous Epithelial / LPF 0-5 (*)    All other components within normal limits  LIPASE, BLOOD  CBC  I-STAT BETA HCG BLOOD, ED ([facility], [facility], [facility] ONLY)   EKG  EKG Interpretation None      Radiology US Transvaginal Non-ob  Result Date: 12/30/2016 CLINICAL DATA:  Pelvic and right lower quadrant pain for 3 days. Structure between the bladder and he uterus on CT earlier this day. EXAM: TRANSABDOMINAL AND TRANSVAGINAL ULTRASOUND OF PELVIS DOPPLER ULTRASOUND OF OVARIES TECHNIQUE: Both transabdominal and transvaginal ultrasound examinations of the pelvis were performed. Transabdominal technique was performed for global imaging of the pelvis including uterus, ovaries, adnexal regions, and pelvic cul-de-sac. It was necessary to proceed with endovaginal exam following the transabdominal exam to visualize the right and left ovary. Color and duplex Doppler ultrasound was utilized to evaluate blood flow to the ovaries. COMPARISON:  CT abdomen/ pelvis earlier this day. Pelvic ultrasound 08/18/2015 FINDINGS: Uterus Measurements: 8.3 x 4.0 x 5.9 cm. There is a 2.3 x 1.9 x 1.8 cm exophytic fibroid arising from the dome of the uterine fundus that has internal blood flow, corresponding to the abnormality on CT. Endometrium Thickness: 2 mm.  No focal abnormality visualized. Right ovary Measurements: 3.3 x 2.4 x 2.7 cm. Normal appearance with physiologic follicles and normal blood flow. No adnexal mass. Prominent adnexal vascularity. Left ovary Measurements: 2.7 x 2.0 x 2.7 cm. Normal appearance with physiologic follicles and normal blood flow. No adnexal mass. Prominent adnexal vascularity. Pulsed Doppler evaluation of both ovaries demonstrates normal low-resistance arterial and venous waveforms. Other findings No abnormal free fluid.  Trace free fluid is physiologic. IMPRESSION: 1. Exophytic/pedunculated fibroid arising from the dome of the uterus corresponding to the structure seen on CT. 2. No ovarian torsion. Normal  appearance of both ovaries with normal blood flow. 3. Prominent adnexal vascularity, can be seen with pelvic congestion syndrome. Electronically Signed   By: Jeb Levering M.D.   On: 12/30/2016 22:13   US Pelvis Complete  Result Date: 12/30/2016 CLINICAL DATA:  Pelvic and right lower quadrant pain for 3 days. Structure between the bladder and he uterus on CT earlier this day. EXAM: TRANSABDOMINAL AND TRANSVAGINAL ULTRASOUND OF PELVIS DOPPLER ULTRASOUND OF OVARIES TECHNIQUE: Both transabdominal and transvaginal ultrasound examinations of the pelvis were performed. Transabdominal technique was performed for global imaging of the pelvis including uterus, ovaries, adnexal regions, and pelvic cul-de-sac. It was necessary to proceed with endovaginal exam following the transabdominal exam to visualize the right and left ovary. Color and duplex Doppler ultrasound was utilized to evaluate blood flow to the ovaries. COMPARISON:  CT abdomen/ pelvis earlier this day. Pelvic ultrasound 08/18/2015 FINDINGS: Uterus Measurements: 8.3 x 4.0 x 5.9 cm. There is  a 2.3 x 1.9 x 1.8 cm exophytic fibroid arising from the dome of the uterine fundus that has internal blood flow, corresponding to the abnormality on CT. Endometrium Thickness: 2 mm.  No focal abnormality visualized. Right ovary Measurements: 3.3 x 2.4 x 2.7 cm. Normal appearance with physiologic follicles and normal blood flow. No adnexal mass. Prominent adnexal vascularity. Left ovary Measurements: 2.7 x 2.0 x 2.7 cm. Normal appearance with physiologic follicles and normal blood flow. No adnexal mass. Prominent adnexal vascularity. Pulsed Doppler evaluation of both ovaries demonstrates normal low-resistance arterial and venous waveforms. Other findings No abnormal free fluid.  Trace free fluid is physiologic. IMPRESSION: 1. Exophytic/pedunculated fibroid arising from the dome of the uterus corresponding to the structure seen on CT. 2. No ovarian torsion. Normal  appearance of both ovaries with normal blood flow. 3. Prominent adnexal vascularity, can be seen with pelvic congestion syndrome. Electronically Signed   By: Jeb Levering M.D.   On: 12/30/2016 22:13   Ct Abdomen Pelvis W Contrast  Result Date: 12/30/2016 CLINICAL DATA:  Right lower quadrant pain 3 days with nausea today. Constipation. EXAM: CT ABDOMEN AND PELVIS WITH CONTRAST TECHNIQUE: Multidetector CT imaging of the abdomen and pelvis was performed using the standard protocol following bolus administration of intravenous contrast. CONTRAST:  153mL ISOVUE-300 IOPAMIDOL (ISOVUE-300) INJECTION 61% COMPARISON:  08/18/2015 FINDINGS: Lower chest: Lung bases are within normal. Hepatobiliary: Within normal. Pancreas: Within normal. Spleen: Within normal. Adrenals/Urinary Tract: Adrenal glands are normal. Kidneys normal in size without hydronephrosis or nephrolithiasis. Ureters and bladder are within normal. Stomach/Bowel: Stomach and small bowel are within normal. Appendix is normal. Colon is within normal. Vascular/Lymphatic: Vascular structures are within normal. There is no evidence adenopathy. Reproductive: The uterus and ovaries are within normal. There are prominent periuterine and pelvic veins left worse than right with prominent left ovarian/gonadal vein draining to the left renal vein. There is an oval dense masslike structure between the bladder and uterine fundus measuring 1.8 x 2.2 cm. There appears to be a fat plane between this structure and the adjacent uterus as well as bladder. Tiny amount of free pelvic fluid. Other: None. Musculoskeletal: Within normal. IMPRESSION: No acute findings in the abdomen/pelvis.  Normal appendix. Prominent pelvic/ periuterine veins left worse than right which could be seen with pelvic venous congestion syndrome. Oval dense masslike structure between the bladder and uterine fundus measuring 1.8 x 2.2 cm of uncertain clinical significance. This appears to be separate  from the adjacent uterus and bladder and could possibly represent a varix. Pelvic ultrasound may be helpful for further evaluation. Electronically Signed   By: Marin Olp M.D.   On: 12/30/2016 21:06   [place] Art/ven Flow Abd Pelv Doppler  Result Date: 12/30/2016 CLINICAL DATA:  Pelvic and right lower quadrant pain for 3 days. Structure between the bladder and he uterus on CT earlier this day. EXAM: TRANSABDOMINAL AND TRANSVAGINAL ULTRASOUND OF PELVIS DOPPLER ULTRASOUND OF OVARIES TECHNIQUE: Both transabdominal and transvaginal ultrasound examinations of the pelvis were performed. Transabdominal technique was performed for global imaging of the pelvis including uterus, ovaries, adnexal regions, and pelvic cul-de-sac. It was necessary to proceed with endovaginal exam following the transabdominal exam to visualize the right and left ovary. Color and duplex Doppler ultrasound was utilized to evaluate blood flow to the ovaries. COMPARISON:  CT abdomen/ pelvis earlier this day. Pelvic ultrasound 08/18/2015 FINDINGS: Uterus Measurements: 8.3 x 4.0 x 5.9 cm. There is a 2.3 x 1.9 x 1.8 cm exophytic fibroid arising from the  dome of the uterine fundus that has internal blood flow, corresponding to the abnormality on CT. Endometrium Thickness: 2 mm.  No focal abnormality visualized. Right ovary Measurements: 3.3 x 2.4 x 2.7 cm. Normal appearance with physiologic follicles and normal blood flow. No adnexal mass. Prominent adnexal vascularity. Left ovary Measurements: 2.7 x 2.0 x 2.7 cm. Normal appearance with physiologic follicles and normal blood flow. No adnexal mass. Prominent adnexal vascularity. Pulsed Doppler evaluation of both ovaries demonstrates normal low-resistance arterial and venous waveforms. Other findings No abnormal free fluid.  Trace free fluid is physiologic. IMPRESSION: 1. Exophytic/pedunculated fibroid arising from the dome of the uterus corresponding to the structure seen on CT. 2. No ovarian torsion.  Normal appearance of both ovaries with normal blood flow. 3. Prominent adnexal vascularity, can be seen with pelvic congestion syndrome. Electronically Signed   By: Jeb Levering M.D.   On: 12/30/2016 22:13   Procedures Procedures (including critical care time)  Medications Ordered in ED Medications  ketorolac (TORADOL) 30 MG/ML injection 30 mg (30 mg Intravenous Given 12/30/16 2007)  iopamidol (ISOVUE-300) 61 % injection (100 mLs  Contrast Given 12/30/16 2032)  naproxen (NAPROSYN) tablet 500 mg (500 mg Oral Given 12/30/16 2245)    Initial Impression / Assessment and Plan / ED Course  I have reviewed the triage vital signs and the nursing notes.  26 y.o. female with above stated PMHx, HPI, and physical. History as above.  Serum beta-hCG negative. UA with many bacteria and small leukocytes but epithelial cells present - Pt denies dysuria or urinary frequency or urinary urgency or urinary malodor - suspect contaminated sample. Labs unremarkable. CT abdomen and pelvis showing no evidence of acute appendicitis. Pelvic ultrasound showing no evidence of ovarian torsion. Given patient's history of ovarian cysts, likely recurrence vs uterine fibroid vs pelvic congestion syndrome.  Laboratory and imaging results were personally reviewed by myself and used in the medical decision making of this patient's treatment and disposition.  Pt discharged home in stable condition. Strict ED return precautions dicussed. Pt understands and agrees with the plan and has no further questions or concerns.   Pt care discussed with and followed by my attending, Dr. Tanna Furry  Mayer Camel, MD Pager [phone]  Final Clinical Impressions(s) / ED Diagnoses   Final diagnoses:  Pelvic pain in female  Pelvic congestion syndrome  Uterine leiomyoma   New Prescriptions Discharge Medication List as of 12/30/2016 10:38 PM    START taking these medications   Details  naproxen (NAPROSYN) 500 MG tablet Take 1 tablet  (500 mg total) by mouth 2 (two) times daily., Starting Sun 12/30/2016, Print         Mayer Camel, MD 12/31/16 0020    Tanna Furry, MD 01/11/17 [phone]

## 2016-12-30 NOTE — ED Notes (Signed)
Pt has h/s of ovarian cysts

## 2016-12-30 NOTE — ED Notes (Signed)
Patient transported to Ultrasound 

## 2016-12-30 NOTE — ED Notes (Signed)
Pt back from CT

## 2017-01-12 ENCOUNTER — Encounter (HOSPITAL_COMMUNITY): Payer: Self-pay | Admitting: Vascular Surgery

## 2017-01-12 ENCOUNTER — Emergency Department (HOSPITAL_COMMUNITY)
Admission: EM | Admit: 2017-01-12 | Discharge: 2017-01-13 | Disposition: A | Discharge status: Home / Self Care | Payer: Self-pay | Attending: Emergency Medicine | Admitting: Emergency Medicine

## 2017-01-12 DIAGNOSIS — Z7982 Long term (current) use of aspirin: Secondary | ICD-10-CM | POA: Insufficient documentation

## 2017-01-12 DIAGNOSIS — Z79899 Other long term (current) drug therapy: Secondary | ICD-10-CM | POA: Insufficient documentation

## 2017-01-12 DIAGNOSIS — Z87891 Personal history of nicotine dependence: Secondary | ICD-10-CM | POA: Insufficient documentation

## 2017-01-12 DIAGNOSIS — N12 Tubulo-interstitial nephritis, not specified as acute or chronic: Secondary | ICD-10-CM | POA: Insufficient documentation

## 2017-01-12 DIAGNOSIS — J45909 Unspecified asthma, uncomplicated: Secondary | ICD-10-CM | POA: Insufficient documentation

## 2017-01-12 LAB — I-STAT BETA HCG BLOOD, ED (MC, WL, AP ONLY): I-stat hCG, quantitative: <5 m[IU]/mL (ref <5)

## 2017-01-12 LAB — URINALYSIS, ROUTINE W REFLEX MICROSCOPIC
Bilirubin Urine: NEGATIVE
GLUCOSE, UA: NEGATIVE mg/dL
Ketones, ur: NEGATIVE mg/dL
NITRITE: NEGATIVE
PH: 7 (ref 5.0–8.0)
Protein, ur: 100 mg/dL — AB
Specific Gravity, Urine: 1.013 (ref 1.005–1.030)

## 2017-01-12 LAB — CBC
HEMATOCRIT: 36.9 % (ref 36.0–46.0)
Hemoglobin: 12.2 g/dL (ref 12.0–15.0)
MCH: 30 pg (ref 26.0–34.0)
MCHC: 33.1 g/dL (ref 30.0–36.0)
MCV: 90.9 fL (ref 78.0–100.0)
PLATELETS: 259 10*3/uL (ref 150–400)
RBC: 4.06 MIL/uL (ref 3.87–5.11)
RDW: 13.6 % (ref 11.5–15.5)
WBC: 10.3 10*3/uL (ref 4.0–10.5)

## 2017-01-12 LAB — COMPREHENSIVE METABOLIC PANEL
ALT: 10 U/L — ABNORMAL LOW (ref 14–54)
AST: 14 U/L — AB (ref 15–41)
Albumin: 3.3 g/dL — ABNORMAL LOW (ref 3.5–5.0)
Alkaline Phosphatase: 35 U/L — ABNORMAL LOW (ref 38–126)
Anion gap: 11 (ref 5–15)
BILIRUBIN TOTAL: 0.4 mg/dL (ref 0.3–1.2)
BUN: 10 mg/dL (ref 6–20)
CO2: 24 mmol/L (ref 22–32)
Calcium: 9.3 mg/dL (ref 8.9–10.3)
Chloride: 103 mmol/L (ref 101–111)
Creatinine, Ser: 0.77 mg/dL (ref 0.44–1.00)
Glucose, Bld: 94 mg/dL (ref 65–99)
POTASSIUM: 3.7 mmol/L (ref 3.5–5.1)
Sodium: 138 mmol/L (ref 135–145)
TOTAL PROTEIN: 6.7 g/dL (ref 6.5–8.1)

## 2017-01-12 LAB — LIPASE, BLOOD: Lipase: 20 U/L (ref 11–51)

## 2017-01-12 NOTE — ED Triage Notes (Addendum)
Pt reports to the ED for eval of lower abd pain. She reports she was here recently and was told she had fluid on her fallopian tubes and she was given an antiinflammatory and d/c. However, she has been taking the medication and has no relief in her pain. She also reports she thinks she has a UTI or kidney infection because she gets them often. Reports urinary frequency and right sided back pain. Reports some nausea. Denies any active V/D.

## 2017-01-13 MED ORDER — CEPHALEXIN 500 MG PO CAPS: 500.0000 mg | ORAL_CAPSULE | Freq: Two times a day (BID) | ORAL | 0 refills | Status: DC

## 2017-01-13 MED ORDER — TRAMADOL HCL 50 MG PO TABS: 50.0000 mg | ORAL_TABLET | Freq: Four times a day (QID) | ORAL | 0 refills | Status: DC | PRN

## 2017-01-13 MED ORDER — CEFTRIAXONE SODIUM 1 G IJ SOLR
1.0000 g | Freq: Once | INTRAMUSCULAR | Status: AC
  Administered 2017-01-13: 1 g via INTRAMUSCULAR
  Filled 2017-01-13: qty 10

## 2017-01-13 MED ORDER — STERILE WATER FOR INJECTION IJ SOLN
INTRAMUSCULAR | Status: AC
  Administered 2017-01-13: 01:00:00
  Filled 2017-01-13: qty 10

## 2017-01-13 MED ORDER — KETOROLAC TROMETHAMINE 60 MG/2ML IM SOLN
60.0000 mg | Freq: Once | INTRAMUSCULAR | Status: AC
  Administered 2017-01-13: 60 mg via INTRAMUSCULAR
  Filled 2017-01-13: qty 2

## 2017-01-13 NOTE — ED Provider Notes (Signed)
[facility] DEPT Provider Note   CSN: [id number] Arrival date & time: 01/12/17  2128   By signing my name below, I, Eunice Blase, attest that this documentation has been prepared under the direction and in the presence of Orpah Greek, MD. Electronically signed, Eunice Blase, ED Scribe. 01/13/17. 12:38 AM.   History   Chief Complaint Chief Complaint  Patient presents with  . Abdominal Pain   The history is provided by the patient and medical records. No language interpreter was used.    HPI Comments: Mindy [name] is a 26 y.o. female with Hx of multiple UTI's and Kidney infections who presents to the Emergency Department complaining of gradually worsening right flank pain x < 24 hours.  She states her pain feels similar to prior kidney infections. She reports associated mild fever, nausea and urinary frequency. Pt denies vomiting and diarrhea.  She also expresses concern for pelvic pain. She notes she was recently told she has fluid on her fallopian tubes and given antiinflammatory medications for the issue. She notes no relief to her pain from the medications. Pt adds that she will see her OB/GYN within the next month to refill birth control medications.  Past Medical History:  Diagnosis Date  . Asthma    exercise induced- no meds  . UTI (lower urinary tract infection)     Patient Active Problem List   Diagnosis Date Noted  . Acne vulgaris 03/31/2013    Past Surgical History:  Procedure Laterality Date  . NO PAST SURGERIES      OB History    Gravida Para Term Preterm AB Living   1 1 1     1    SAB TAB Ectopic Multiple Live Births           1       Home Medications    Prior to Admission medications   Medication Sig Start Date End Date Taking? Authorizing Provider  aspirin 325 MG tablet Take 325 mg by mouth daily as needed for mild pain or moderate pain.    Historical Provider, MD  Aspirin-Salicylamide-Caffeine (BC HEADACHE) 325-95-16 MG TABS Take  1 packet by mouth daily as needed (for pain).    Historical Provider, MD  cephALEXin (KEFLEX) 500 MG capsule Take 1 capsule (500 mg total) by mouth 2 (two) times daily. 01/13/17   Orpah Greek, MD  naproxen (NAPROSYN) 500 MG tablet Take 1 tablet (500 mg total) by mouth 2 (two) times daily. 12/30/16   Tanna Furry, MD  norgestimate-ethinyl estradiol (ORTHO-CYCLEN,SPRINTEC,PREVIFEM) 0.25-35 MG-MCG tablet Take 1 tablet by mouth daily. 03/30/16   Jonnie Kind, MD  traMADol (ULTRAM) 50 MG tablet Take 1 tablet (50 mg total) by mouth every 6 (six) hours as needed. 01/13/17   Orpah Greek, MD    Family History Family History  Problem Relation Age of Onset  . Heart murmur Son   . Cancer Maternal Aunt     lung cancer  . Heart disease Other     CAD  . Diabetes Other     Social History Social History  Substance Use Topics  . Smoking status: Former Smoker    Packs/day: 0.50    Years: 3.00    Types: Cigarettes, E-cigarettes    Quit date: 11/27/2010  . Smokeless tobacco: Never Used     Comment: vape  . Alcohol use Yes     Comment: occ     Allergies   Ibuprofen   Review of Systems Review of  Systems  All other systems reviewed and are negative.  A complete 10 system review of systems was obtained and all systems are negative except as noted in the HPI and PMH.    Physical Exam Updated Vital Signs BP 127/78 (BP Location: Left Arm)   Pulse 99   Temp 100.1 F (37.8 C) (Oral)   Resp 18   SpO2 98%   Physical Exam  Constitutional: She is oriented to person, place, and time. She appears well-developed and well-nourished. No distress.  HENT:  Head: Normocephalic and atraumatic.  Right Ear: Hearing normal.  Left Ear: Hearing normal.  Nose: Nose normal.  Mouth/Throat: Oropharynx is clear and moist and mucous membranes are normal.  Eyes: Conjunctivae and EOM are normal. Pupils are equal, round, and reactive to light.  Neck: Normal range of motion. Neck supple.    Cardiovascular: Regular rhythm, S1 normal and S2 normal.  Exam reveals no gallop and no friction rub.   No murmur heard. Pulmonary/Chest: Effort normal and breath sounds normal. No respiratory distress. She exhibits no tenderness.  Abdominal: Soft. Normal appearance and bowel sounds are normal. There is no hepatosplenomegaly. There is no tenderness. There is CVA tenderness. There is no rebound, no guarding, no tenderness at McBurney's point and negative Murphy's sign. No hernia.  Musculoskeletal: Normal range of motion.  Neurological: She is alert and oriented to person, place, and time. She has normal strength. No cranial nerve deficit or sensory deficit. Coordination normal. GCS eye subscore is 4. GCS verbal subscore is 5. GCS motor subscore is 6.  Skin: Skin is warm, dry and intact. No rash noted. No cyanosis.  Psychiatric: She has a normal mood and affect. Her speech is normal and behavior is normal. Thought content normal.  Nursing note and vitals reviewed.    ED Treatments / Results  DIAGNOSTIC STUDIES: Oxygen Saturation is 98% on RA, normal by my interpretation.    COORDINATION OF CARE: 12:24 AM Discussed treatment plan with pt at bedside and pt agreed to plan. Will order IV, abx and pain medication. Will order Rx for abx. Pt advised to F/U with OB/GYN.  Labs (all labs ordered are listed, but only abnormal results are displayed) Labs Reviewed  COMPREHENSIVE METABOLIC PANEL - Abnormal; Notable for the following:       Result Value   Albumin 3.3 (*)    AST 14 (*)    ALT 10 (*)    Alkaline Phosphatase 35 (*)    All other components within normal limits  URINALYSIS, ROUTINE W REFLEX MICROSCOPIC - Abnormal; Notable for the following:    APPearance CLOUDY (*)    Hgb urine dipstick SMALL (*)    Protein, ur 100 (*)    Leukocytes, UA LARGE (*)    Bacteria, UA RARE (*)    Squamous Epithelial / LPF 0-5 (*)    Non Squamous Epithelial 0-5 (*)    All other components within normal  limits  LIPASE, BLOOD  CBC  I-STAT BETA HCG BLOOD, ED ([facility], [facility], [facility] ONLY)    EKG  EKG Interpretation None       Radiology No results found.  Procedures Procedures (including critical care time)  Medications Ordered in ED Medications  cefTRIAXone (ROCEPHIN) injection 1 g (not administered)  ketorolac (TORADOL) injection 60 mg (not administered)  sterile water (preservative free) injection (not administered)     Initial Impression / Assessment and Plan / ED Course  I have reviewed the triage vital signs and the nursing notes.  Pertinent labs & imaging results that were available during my care of the patient were reviewed by me and considered in my medical decision making (see chart for details).    Patient with complaints of right-sided flank pain and urinary frequency. She does have a history of recurrent urinary tract infection. She did have low-grade fever today. There is also evidence of CVA tenderness. Blood work was entirely normal but urinalysis shows obvious infection. Presentation consistent with pyelonephritis. Patient was in the ER recently for pelvic pain and had thorough workup including pelvic exam, CT abdomen and pelvis, cul-de-sac ultrasound. The results of this was simple pelvic congestion of unclear etiology. Patient does have an OB/GYN doctor. She will follow-up with OB/GYN for follow-up on this pelvic congestion as well as further evaluation for her frequent urinary tract infections. Was administered Rocephin here in the ER, will continue outpatient antibiotics. Examination today is unremarkable. She does not have any abdominal tenderness, no tenderness at McBurney's point to consider appendicitis as a possibility.  I personally performed the services described in this documentation, which was scribed in my presence. The recorded information has been reviewed and is accurate.   Final Clinical Impressions(s) / ED Diagnoses   Final diagnoses:  Pyelonephritis     New Prescriptions New Prescriptions   CEPHALEXIN (KEFLEX) 500 MG CAPSULE    Take 1 capsule (500 mg total) by mouth 2 (two) times daily.   TRAMADOL (ULTRAM) 50 MG TABLET    Take 1 tablet (50 mg total) by mouth every 6 (six) hours as needed.     Orpah Greek, MD 01/13/17 [phone]

## 2017-02-18 ENCOUNTER — Telehealth: Payer: Self-pay | Admitting: Obstetrics and Gynecology

## 2017-02-18 NOTE — Telephone Encounter (Signed)
Pt was prescribed BC pills and has 6 pills left. Refills need authorization. [facility]

## 2017-02-18 NOTE — Telephone Encounter (Signed)
Pt was prescribed BC pills by Dr. Glo Herring. Pt has 6 pills left and refills need authorization. [facility] [address] in [place].

## 2017-02-20 ENCOUNTER — Telehealth: Payer: Self-pay | Admitting: *Deleted

## 2017-02-20 ENCOUNTER — Other Ambulatory Visit: Payer: Self-pay | Admitting: *Deleted

## 2017-02-20 MED ORDER — NORGESTIMATE-ETH ESTRADIOL 0.25-35 MG-MCG PO TABS: 1.0000 | ORAL_TABLET | Freq: Every day | ORAL | 11 refills | Status: DC

## 2017-02-20 NOTE — Telephone Encounter (Signed)
Patient wants refill x1 until next appointment on 03/06/17.

## 2017-03-06 ENCOUNTER — Encounter: Payer: Self-pay | Admitting: Advanced Practice Midwife

## 2017-08-21 ENCOUNTER — Emergency Department (HOSPITAL_COMMUNITY): Payer: Self-pay

## 2017-08-21 ENCOUNTER — Encounter (HOSPITAL_COMMUNITY): Payer: Self-pay | Admitting: Emergency Medicine

## 2017-08-21 DIAGNOSIS — J45909 Unspecified asthma, uncomplicated: Secondary | ICD-10-CM | POA: Insufficient documentation

## 2017-08-21 DIAGNOSIS — J069 Acute upper respiratory infection, unspecified: Secondary | ICD-10-CM | POA: Insufficient documentation

## 2017-08-21 DIAGNOSIS — Z79899 Other long term (current) drug therapy: Secondary | ICD-10-CM | POA: Insufficient documentation

## 2017-08-21 DIAGNOSIS — Z87891 Personal history of nicotine dependence: Secondary | ICD-10-CM | POA: Insufficient documentation

## 2017-08-21 LAB — COMPREHENSIVE METABOLIC PANEL
ALT: 10 U/L — ABNORMAL LOW (ref 14–54)
AST: 15 U/L (ref 15–41)
Albumin: 3.6 g/dL (ref 3.5–5.0)
Alkaline Phosphatase: 46 U/L (ref 38–126)
Anion gap: 6 (ref 5–15)
BUN: <5 mg/dL — ABNORMAL LOW (ref 6–20)
CHLORIDE: 103 mmol/L (ref 101–111)
CO2: 27 mmol/L (ref 22–32)
Calcium: 8.9 mg/dL (ref 8.9–10.3)
Creatinine, Ser: 0.74 mg/dL (ref 0.44–1.00)
Glucose, Bld: 84 mg/dL (ref 65–99)
Potassium: 3.8 mmol/L (ref 3.5–5.1)
SODIUM: 136 mmol/L (ref 135–145)
Total Bilirubin: 0.6 mg/dL (ref 0.3–1.2)
Total Protein: 7 g/dL (ref 6.5–8.1)

## 2017-08-21 LAB — URINALYSIS, ROUTINE W REFLEX MICROSCOPIC
GLUCOSE, UA: NEGATIVE mg/dL
HGB URINE DIPSTICK: NEGATIVE
Ketones, ur: 5 mg/dL — AB
LEUKOCYTES UA: NEGATIVE
Nitrite: NEGATIVE
PH: 6 (ref 5.0–8.0)
Protein, ur: NEGATIVE mg/dL
SPECIFIC GRAVITY, URINE: 1.023 (ref 1.005–1.030)

## 2017-08-21 LAB — CBC
HEMATOCRIT: 35.9 % — AB (ref 36.0–46.0)
Hemoglobin: 12 g/dL (ref 12.0–15.0)
MCH: 30.2 pg (ref 26.0–34.0)
MCHC: 33.4 g/dL (ref 30.0–36.0)
MCV: 90.2 fL (ref 78.0–100.0)
Platelets: 294 10*3/uL (ref 150–400)
RBC: 3.98 MIL/uL (ref 3.87–5.11)
RDW: 12.3 % (ref 11.5–15.5)
WBC: 10.4 10*3/uL (ref 4.0–10.5)

## 2017-08-21 LAB — RAPID STREP SCREEN (MED CTR MEBANE ONLY): Streptococcus, Group A Screen (Direct): NEGATIVE

## 2017-08-21 LAB — LIPASE, BLOOD: LIPASE: 24 U/L (ref 11–51)

## 2017-08-21 NOTE — ED Triage Notes (Signed)
Patient reports persistent productive cough with nasal congestion/rhinorrhea /sore throat onset 2 weeks ago , pt. added mid abdominal pain this week with nausea and diarrhea , denies fever or chills .

## 2017-08-22 ENCOUNTER — Emergency Department (HOSPITAL_COMMUNITY)
Admission: EM | Admit: 2017-08-22 | Discharge: 2017-08-22 | Disposition: A | Discharge status: Home / Self Care | Payer: Self-pay | Attending: Emergency Medicine | Admitting: Emergency Medicine

## 2017-08-22 DIAGNOSIS — J069 Acute upper respiratory infection, unspecified: Secondary | ICD-10-CM

## 2017-08-22 LAB — I-STAT BETA HCG BLOOD, ED (MC, WL, AP ONLY): I-stat hCG, quantitative: <5 m[IU]/mL (ref <5)

## 2017-08-22 MED ORDER — BENZONATATE 100 MG PO CAPS: 200.0000 mg | ORAL_CAPSULE | Freq: Two times a day (BID) | ORAL | 0 refills | Status: DC | PRN

## 2017-08-22 NOTE — ED Provider Notes (Signed)
[facility] DEPT Provider Note   CSN: [id number] Arrival date & time: 08/21/17  2038     History   Chief Complaint Chief Complaint  Patient presents with  . Cough  . Abdominal Pain    HPI Mindy [name] is a 26 y.o. female.  Patient presents emergency department with chief complaint of cough and cold symptoms. She reports cough 2 weeks. Reports associated nasal congestion, rhinorrhea, sore throat, generalized body aches, and some nausea and diarrhea. She denies having received a flu shot this year. She states that she has tried OTC cough and cold medications with no relief. She denies fever.She denies any other medical problems. Denies any dysuria, vaginal discharge, or bleeding.   The history is provided by the patient. No language interpreter was used.    Past Medical History:  Diagnosis Date  . Asthma    exercise induced- no meds  . UTI (lower urinary tract infection)     Patient Active Problem List   Diagnosis Date Noted  . Acne vulgaris 03/31/2013    Past Surgical History:  Procedure Laterality Date  . NO PAST SURGERIES      OB History    Gravida Para Term Preterm AB Living   1 1 1     1    SAB TAB Ectopic Multiple Live Births           1       Home Medications    Prior to Admission medications   Medication Sig Start Date End Date Taking? Authorizing Provider  aspirin 325 MG tablet Take 325 mg by mouth daily as needed for mild pain or moderate pain.    [provider]  Aspirin-Salicylamide-Caffeine (BC HEADACHE) 325-95-16 MG TABS Take 1 packet by mouth daily as needed (for pain).    [provider]  cephALEXin (KEFLEX) 500 MG capsule Take 1 capsule (500 mg total) by mouth 2 (two) times daily. 01/13/17   Orpah Greek, MD  naproxen (NAPROSYN) 500 MG tablet Take 1 tablet (500 mg total) by mouth 2 (two) times daily. 12/30/16   Tanna Furry, MD  norgestimate-ethinyl estradiol (ORTHO-CYCLEN,SPRINTEC,PREVIFEM) 0.25-35 MG-MCG  tablet Take 1 tablet by mouth daily. 02/20/17   Cresenzo-Dishmon, Joaquim Lai, CNM  traMADol (ULTRAM) 50 MG tablet Take 1 tablet (50 mg total) by mouth every 6 (six) hours as needed. 01/13/17   Orpah Greek, MD    Family History Family History  Problem Relation Age of Onset  . Heart murmur Son   . Cancer Maternal Aunt        lung cancer  . Heart disease Other        CAD  . Diabetes Other     Social History Social History  Substance Use Topics  . Smoking status: Former Smoker    Packs/day: 0.50    Years: 3.00    Types: Cigarettes, E-cigarettes    Quit date: 11/27/2010  . Smokeless tobacco: Never Used     Comment: vape  . Alcohol use Yes     Comment: occ     Allergies   Ibuprofen   Review of Systems Review of Systems  Constitutional: Positive for chills. Negative for fever.  HENT: Positive for postnasal drip, rhinorrhea, sinus pressure, sneezing and sore throat.   Respiratory: Positive for cough. Negative for shortness of breath.   Cardiovascular: Negative for chest pain.  Gastrointestinal: Positive for diarrhea. Negative for abdominal pain, constipation, nausea and vomiting.  Genitourinary: Negative for dysuria.     Physical Exam  Updated Vital Signs BP 110/75   Pulse 68   Temp 98.1 F (36.7 C)   Resp 16   LMP 08/07/2017 (Approximate)   SpO2 99%   Physical Exam Nursing note and vitals reviewed.  Constitutional: Appears well-developed and well-nourished. No distress.  HENT: Oropharynx is mildly erythematous, no tonsillar exudates or abscess, airway intact. Eyes: Conjunctivae are normal. Pupils are equal, round, and reactive to light.  Neck: Normal range of motion and full passive range of motion without pain.  Cardiovascular: 68 and intact distal pulses.   Pulmonary/Chest: Effort normal and breath sounds normal. No stridor. No wheezes, rhonchi, or rales. Abdominal: Soft. There is no focal abdominal tenderness.  Musculoskeletal: Normal range of motion.  Moves all extremities. Neurological: Pt is alert and oriented to person, place, and time. Sensation and strength grossly intact throughout. Skin: Skin is warm and dry. No rash noted. Pt is not diaphoretic.  Psychiatric: Normal mood and affect.    ED Treatments / Results  Labs (all labs ordered are listed, but only abnormal results are displayed) Labs Reviewed  COMPREHENSIVE METABOLIC PANEL - Abnormal; Notable for the following:       Result Value   BUN <5 (*)    ALT 10 (*)    All other components within normal limits  CBC - Abnormal; Notable for the following:    HCT 35.9 (*)    All other components within normal limits  URINALYSIS, ROUTINE W REFLEX MICROSCOPIC - Abnormal; Notable for the following:    APPearance HAZY (*)    Bilirubin Urine SMALL (*)    Ketones, ur 5 (*)    All other components within normal limits  RAPID STREP SCREEN (NOT AT [facility])  CULTURE, GROUP A STREP ([place])  LIPASE, BLOOD  I-STAT BETA HCG BLOOD, ED ([facility], [facility], [facility] ONLY)    EKG  EKG Interpretation None       Radiology Dg Chest 2 View  Result Date: 08/21/2017 CLINICAL DATA:  Productive cough EXAM: CHEST  2 VIEW COMPARISON:  09/15/2014 FINDINGS: The heart size and mediastinal contours are within normal limits. Both lungs are clear. The visualized skeletal structures are unremarkable. IMPRESSION: No active cardiopulmonary disease. Electronically Signed   By: Donavan Foil M.D.   On: 08/21/2017 21:59    Procedures Procedures (including critical care time)  Medications Ordered in ED Medications - No data to display   Initial Impression / Assessment and Plan / ED Course  I have reviewed the triage vital signs and the nursing notes.  Pertinent labs & imaging results that were available during my care of the patient were reviewed by me and considered in my medical decision making (see chart for details).     Pt CXR negative for acute infiltrate. Patients symptoms are consistent with URI, likely viral  etiology. Discussed that antibiotics are not indicated for viral infections. Pt will be discharged with symptomatic treatment.  Verbalizes understanding and is agreeable with plan. Pt is hemodynamically stable & in NAD prior to dc.   Final Clinical Impressions(s) / ED Diagnoses   Final diagnoses:  Upper respiratory tract infection, unspecified type    New Prescriptions New Prescriptions   BENZONATATE (TESSALON) 100 MG CAPSULE    Take 2 capsules (200 mg total) by mouth 2 (two) times daily as needed for cough.     Montine Circle, PA-C 08/22/17 0116    Ward, Delice Bison, DO 08/22/17 [phone]

## 2017-08-24 LAB — CULTURE, GROUP A STREP (THRC)

## 2017-11-24 ENCOUNTER — Other Ambulatory Visit: Payer: Self-pay | Admitting: Obstetrics and Gynecology

## 2018-01-10 ENCOUNTER — Telehealth: Payer: Self-pay | Admitting: Obstetrics & Gynecology

## 2018-01-10 ENCOUNTER — Telehealth: Payer: Self-pay | Admitting: Advanced Practice Midwife

## 2018-01-10 MED ORDER — NORGESTIMATE-ETH ESTRADIOL 0.25-35 MG-MCG PO TABS: 1.0000 | ORAL_TABLET | Freq: Every day | ORAL | 12 refills | Status: DC

## 2018-01-10 NOTE — Telephone Encounter (Signed)
Refill on [facility] requested. Per patient back order on Ortho cyclen and Sprintec so has been taking Mononessa. Verbal order from Dr Glo Herring for [facility].

## 2018-04-12 ENCOUNTER — Encounter (HOSPITAL_COMMUNITY): Payer: Self-pay

## 2018-04-12 ENCOUNTER — Other Ambulatory Visit: Payer: Self-pay

## 2018-04-12 ENCOUNTER — Emergency Department (HOSPITAL_COMMUNITY)
Admission: EM | Admit: 2018-04-12 | Discharge: 2018-04-12 | Disposition: A | Discharge status: Home / Self Care | Payer: Self-pay | Attending: Physician Assistant | Admitting: Physician Assistant

## 2018-04-12 DIAGNOSIS — L299 Pruritus, unspecified: Secondary | ICD-10-CM | POA: Insufficient documentation

## 2018-04-12 DIAGNOSIS — R21 Rash and other nonspecific skin eruption: Secondary | ICD-10-CM | POA: Insufficient documentation

## 2018-04-12 LAB — POC URINE PREG, ED: PREG TEST UR: NEGATIVE

## 2018-04-12 MED ORDER — HYDROCORTISONE 1 % EX CREA: TOPICAL_CREAM | CUTANEOUS | 0 refills | Status: DC

## 2018-04-12 NOTE — Discharge Instructions (Signed)
Please see the information and instructions below regarding your visit.  Your diagnoses today include:  1. Itching of ear   2. Rash and nonspecific skin eruption    Examination is reassuring today for a possible allergic reaction.  Please monitor symptoms to make sure they are improving while using the corticosteroid hydrocortisone 1% cream.  If your ear becomes more puffy, or has drainage, or becomes more red, you need to come back and see [place].  Tests performed today include: See side panel of your discharge paperwork for testing performed today. Vital signs are listed at the bottom of these instructions.   Medications prescribed:    Take any prescribed medications only as prescribed, and any over the counter medications only as directed on the packaging.  Please apply hydrocortisone 1% to the areas that are irritating you twice daily.  Home care instructions:  Please follow any educational materials contained in this packet.   Please avoid the particular dye you used to diarrhea hair in the future.  Follow-up instructions: Please follow-up with your primary care provider as soon as possible for further evaluation of your symptoms if they are not completely improved.   Return instructions:  Please return to the Emergency Department if you experience worsening symptoms.  Please return to the emergency department if you develop any puffiness of the ears, increase in redness, or drainage from the ear. Please return if you have any other emergent concerns.  Additional Information:   Your vital signs today were: BP 123/70 (BP Location: Right Arm)    Pulse 73    Temp 98 F (36.7 C) (Oral)    Resp 16    LMP 04/12/2018 (Exact Date)    SpO2 100%  If your blood pressure (BP) was elevated on multiple readings during this visit above 130 for the top number or above 80 for the bottom number, please have this repeated by your primary care provider within one month. --------------  Thank you  for allowing [place] to participate in your care today.

## 2018-04-12 NOTE — ED Triage Notes (Signed)
Pt endorses ear itching and rash on ears bilaterally since having her hair done on Wednesday. No hives or itching anywhere else. Pt took benadryl this morning pta and itching is gone. VSS. Airway intact.

## 2018-04-12 NOTE — ED Provider Notes (Signed)
[facility] EMERGENCY DEPARTMENT Provider Note   CSN: [id number] Arrival date & time: 04/12/18  1133     History   Chief Complaint Chief Complaint  Patient presents with  . Rash    HPI Mindy [name] is a 27 y.o. female.  HPI  Patient is a 27 year old female with a past medical history of asthma presenting for bilateral ear pruritus.  Patient reports symptoms began 2 days ago.  Patient reports that she cannot stop itching her ears, and they have turned red and irritated.  Patient denies any drainage from the ears, pain inside the ears, difficulty hearing.  Patient denies any recent ear piercings, or change in metal of the earrings in her ears.  Patient reports that she died her hair 3 days ago, and use a new diet, however it is at the same brand that she typically has used for many years.  Patient also notes that she used a same toner on her hair, that has been previously used.  Patient denies any recent changes in shampoos, detergents, lotions, soaps, glasses, or other jewelry.  Past Medical History:  Diagnosis Date  . Asthma    exercise induced- no meds  . UTI (lower urinary tract infection)     Patient Active Problem List   Diagnosis Date Noted  . Acne vulgaris 03/31/2013    Past Surgical History:  Procedure Laterality Date  . NO PAST SURGERIES       OB History    Gravida  1   Para  1   Term  1   Preterm      AB      Living  1     SAB      TAB      Ectopic      Multiple      Live Births  1            Home Medications    Prior to Admission medications   Medication Sig Start Date End Date Taking? Authorizing Provider  aspirin 325 MG tablet Take 325 mg by mouth daily as needed for mild pain or moderate pain.    [provider]  Aspirin-Salicylamide-Caffeine (BC HEADACHE) 325-95-16 MG TABS Take 1 packet by mouth daily as needed (for pain).    [provider]  benzonatate (TESSALON) 100 MG capsule Take 2  capsules (200 mg total) by mouth 2 (two) times daily as needed for cough. 08/22/17   Montine Circle, PA-C  cephALEXin (KEFLEX) 500 MG capsule Take 1 capsule (500 mg total) by mouth 2 (two) times daily. 01/13/17   Orpah Greek, MD  hydrocortisone cream 1 % Apply to affected area 2 times daily 04/12/18   Langston Masker B, PA-C  naproxen (NAPROSYN) 500 MG tablet Take 1 tablet (500 mg total) by mouth 2 (two) times daily. 12/30/16   Tanna Furry, MD  norgestimate-ethinyl estradiol (MONONESSA) 0.25-35 MG-MCG tablet Take 1 tablet by mouth daily. 01/10/18   Jonnie Kind, MD  traMADol (ULTRAM) 50 MG tablet Take 1 tablet (50 mg total) by mouth every 6 (six) hours as needed. 01/13/17   Orpah Greek, MD    Family History Family History  Problem Relation Age of Onset  . Heart murmur Son   . Cancer Maternal Aunt        lung cancer  . Heart disease Other        CAD  . Diabetes Other     Social History Social History  Tobacco Use  . Smoking status: Former Smoker    Packs/day: 0.50    Years: 3.00    Pack years: 1.50    Types: Cigarettes, E-cigarettes    Last attempt to quit: 11/27/2010    Years since quitting: 7.3  . Smokeless tobacco: Never Used  . Tobacco comment: vape  Substance Use Topics  . Alcohol use: Yes    Comment: occ  . Drug use: No     Allergies   Ibuprofen   Review of Systems Review of Systems  Constitutional: Negative for chills and fever.  HENT: Negative for ear discharge and ear pain.   Skin: Positive for color change and rash.     Physical Exam Updated Vital Signs BP 100/62 (BP Location: Right Arm)   Pulse 64   Temp 98.1 F (36.7 C) (Oral)   Resp 16   LMP 04/12/2018 (Exact Date)   SpO2 100%   Physical Exam  Constitutional: She appears well-developed and well-nourished. No distress.  Sitting comfortably in bed.  HENT:  Head: Normocephalic and atraumatic.  Bilateral external auditory canals are not edematous, there is no discharge,  and no erythema of EACs bilaterally. Bilateral pinna and tragus are erythematous, with minor scaling on the post auricular region.  There is no edema of the cartilage of bilateral ears.  No thickening of cartilage.  No fluctuance of cartilage of bilateral ears. Patient has a single piercing through her right antihelical fold, without evidence of thickening of cartilage, or erythema around that piercing.  Eyes: Conjunctivae are normal. Right eye exhibits no discharge. Left eye exhibits no discharge.  EOMs normal to gross examination.  Neck: Normal range of motion.  Cardiovascular: Normal rate and regular rhythm.  Intact, 2+ radial pulse.  Pulmonary/Chest:  Normal respiratory effort. Patient converses comfortably. No audible wheeze or stridor.  Abdominal: She exhibits no distension.  Musculoskeletal: Normal range of motion.  Neurological: She is alert.  Cranial nerves intact to gross observation. Patient moves extremities without difficulty.  Skin: Skin is warm and dry. She is not diaphoretic.  Psychiatric: She has a normal mood and affect. Her behavior is normal. Judgment and thought content normal.  Nursing note and vitals reviewed.    ED Treatments / Results  Labs (all labs ordered are listed, but only abnormal results are displayed) Labs Reviewed  POC URINE PREG, ED    EKG None  Radiology No results found.  Procedures Procedures (including critical care time)  Medications Ordered in ED Medications - No data to display   Initial Impression / Assessment and Plan / ED Course  I have reviewed the triage vital signs and the nursing notes.  Pertinent labs & imaging results that were available during my care of the patient were reviewed by me and considered in my medical decision making (see chart for details).     Patient well-appearing and in no acute distress.  No evidence of perichondritis.  No evidence of otitis externa.  Feel that patient's symptoms are most likely  contact related, likely due to new hair dye.  Encouraged patient to apply low-dose corticosteroid cream, and discontinue future use of such hair dye.  Patient given return precautions for any increasing erythema of the ears, thickening of cartilage consistent with perichondritis.  Patient understanding and agrees with the plan of care.  Final Clinical Impressions(s) / ED Diagnoses   Final diagnoses:  Itching of ear  Rash and nonspecific skin eruption    ED Discharge Orders  Ordered    hydrocortisone cream 1 %     04/12/18 1524       Tamala Julian 04/12/18 1923    Macarthur Critchley, MD 04/13/18 [phone]

## 2018-07-24 ENCOUNTER — Encounter (HOSPITAL_COMMUNITY): Payer: Self-pay

## 2018-07-24 ENCOUNTER — Other Ambulatory Visit: Payer: Self-pay

## 2018-07-24 ENCOUNTER — Emergency Department (HOSPITAL_COMMUNITY)
Admission: EM | Admit: 2018-07-24 | Discharge: 2018-07-24 | Disposition: A | Discharge status: Home / Self Care | Payer: Self-pay | Attending: Emergency Medicine | Admitting: Emergency Medicine

## 2018-07-24 DIAGNOSIS — R110 Nausea: Secondary | ICD-10-CM | POA: Insufficient documentation

## 2018-07-24 DIAGNOSIS — Z5321 Procedure and treatment not carried out due to patient leaving prior to being seen by health care provider: Secondary | ICD-10-CM | POA: Insufficient documentation

## 2018-07-24 DIAGNOSIS — R109 Unspecified abdominal pain: Secondary | ICD-10-CM | POA: Insufficient documentation

## 2018-07-24 LAB — BASIC METABOLIC PANEL
Anion gap: 9 (ref 5–15)
BUN: 10 mg/dL (ref 6–20)
CO2: 22 mmol/L (ref 22–32)
CREATININE: 0.72 mg/dL (ref 0.44–1.00)
Calcium: 9.1 mg/dL (ref 8.9–10.3)
Chloride: 107 mmol/L (ref 98–111)
GFR calc non Af Amer: >60 mL/min (ref >60)
GLUCOSE: 89 mg/dL (ref 70–99)
Potassium: 4 mmol/L (ref 3.5–5.1)
Sodium: 138 mmol/L (ref 135–145)

## 2018-07-24 LAB — URINALYSIS, ROUTINE W REFLEX MICROSCOPIC
BILIRUBIN URINE: NEGATIVE
GLUCOSE, UA: NEGATIVE mg/dL
HGB URINE DIPSTICK: NEGATIVE
Ketones, ur: NEGATIVE mg/dL
LEUKOCYTES UA: NEGATIVE
Nitrite: POSITIVE — AB
PROTEIN: NEGATIVE mg/dL
Specific Gravity, Urine: 1.018 (ref 1.005–1.030)
pH: 5 (ref 5.0–8.0)

## 2018-07-24 LAB — I-STAT BETA HCG BLOOD, ED (MC, WL, AP ONLY): I-stat hCG, quantitative: <5 m[IU]/mL (ref <5)

## 2018-07-24 LAB — CBC
HCT: 40.3 % (ref 36.0–46.0)
Hemoglobin: 13.2 g/dL (ref 12.0–15.0)
MCH: 30.2 pg (ref 26.0–34.0)
MCHC: 32.8 g/dL (ref 30.0–36.0)
MCV: 92.2 fL (ref 78.0–100.0)
PLATELETS: 284 10*3/uL (ref 150–400)
RBC: 4.37 MIL/uL (ref 3.87–5.11)
RDW: 12.5 % (ref 11.5–15.5)
WBC: 7.9 10*3/uL (ref 4.0–10.5)

## 2018-07-24 NOTE — ED Notes (Signed)
Pt states they are leaving and need to go to a house closing at 1pm. Seen leaving ED with family and steady gait.

## 2018-07-24 NOTE — ED Triage Notes (Signed)
Pt reports right kidney pain that started yesterday. She states she has hx of kidney and bladder infections. Pt reports some nausea which is normal for her.

## 2018-07-25 NOTE — ED Notes (Signed)
Follow up call made  Pt states pain is worse  Suggested to pt to follow up w pcp or return to ed if needed  07/25/18  0853  s Kanin Lia rn

## 2019-02-02 NOTE — Telephone Encounter (Signed)
Note sent to nurse. 

## 2019-02-04 ENCOUNTER — Encounter (HOSPITAL_COMMUNITY): Payer: Self-pay | Admitting: *Deleted

## 2019-02-04 ENCOUNTER — Emergency Department (HOSPITAL_COMMUNITY)
Admission: EM | Admit: 2019-02-04 | Discharge: 2019-02-04 | Disposition: A | Discharge status: Home / Self Care | Payer: Self-pay | Attending: Emergency Medicine | Admitting: Emergency Medicine

## 2019-02-04 ENCOUNTER — Emergency Department (HOSPITAL_COMMUNITY): Payer: Self-pay

## 2019-02-04 ENCOUNTER — Other Ambulatory Visit: Payer: Self-pay

## 2019-02-04 DIAGNOSIS — K5900 Constipation, unspecified: Secondary | ICD-10-CM | POA: Insufficient documentation

## 2019-02-04 DIAGNOSIS — R10814 Left lower quadrant abdominal tenderness: Secondary | ICD-10-CM | POA: Insufficient documentation

## 2019-02-04 DIAGNOSIS — Z87891 Personal history of nicotine dependence: Secondary | ICD-10-CM | POA: Insufficient documentation

## 2019-02-04 DIAGNOSIS — R1084 Generalized abdominal pain: Secondary | ICD-10-CM

## 2019-02-04 DIAGNOSIS — R10812 Left upper quadrant abdominal tenderness: Secondary | ICD-10-CM | POA: Insufficient documentation

## 2019-02-04 DIAGNOSIS — R1013 Epigastric pain: Secondary | ICD-10-CM

## 2019-02-04 LAB — CBC WITH DIFFERENTIAL/PLATELET
ABS IMMATURE GRANULOCYTES: 0.02 10*3/uL (ref 0.00–0.07)
Basophils Absolute: 0 10*3/uL (ref 0.0–0.1)
Basophils Relative: 0 %
Eosinophils Absolute: 0.3 10*3/uL (ref 0.0–0.5)
Eosinophils Relative: 4 %
HEMATOCRIT: 42.7 % (ref 36.0–46.0)
Hemoglobin: 14.2 g/dL (ref 12.0–15.0)
IMMATURE GRANULOCYTES: 0 %
LYMPHS ABS: 2.9 10*3/uL (ref 0.7–4.0)
Lymphocytes Relative: 30 %
MCH: 30.5 pg (ref 26.0–34.0)
MCHC: 33.3 g/dL (ref 30.0–36.0)
MCV: 91.8 fL (ref 80.0–100.0)
MONOS PCT: 5 %
Monocytes Absolute: 0.5 10*3/uL (ref 0.1–1.0)
NEUTROS ABS: 5.7 10*3/uL (ref 1.7–7.7)
NEUTROS PCT: 61 %
Platelets: 303 10*3/uL (ref 150–400)
RBC: 4.65 MIL/uL (ref 3.87–5.11)
RDW: 12.6 % (ref 11.5–15.5)
WBC: 9.5 10*3/uL (ref 4.0–10.5)
nRBC: 0 % (ref 0.0–0.2)

## 2019-02-04 LAB — URINALYSIS, ROUTINE W REFLEX MICROSCOPIC
Bilirubin Urine: NEGATIVE
GLUCOSE, UA: NEGATIVE mg/dL
HGB URINE DIPSTICK: NEGATIVE
Ketones, ur: NEGATIVE mg/dL
Leukocytes,Ua: NEGATIVE
Nitrite: NEGATIVE
PROTEIN: NEGATIVE mg/dL
Specific Gravity, Urine: 1.012 (ref 1.005–1.030)
pH: 6 (ref 5.0–8.0)

## 2019-02-04 LAB — COMPREHENSIVE METABOLIC PANEL
ALK PHOS: 52 U/L (ref 38–126)
ALT: 24 U/L (ref 0–44)
AST: 23 U/L (ref 15–41)
Albumin: 4 g/dL (ref 3.5–5.0)
Anion gap: 8 (ref 5–15)
BUN: 8 mg/dL (ref 6–20)
CALCIUM: 9.4 mg/dL (ref 8.9–10.3)
CHLORIDE: 104 mmol/L (ref 98–111)
CO2: 24 mmol/L (ref 22–32)
CREATININE: 0.69 mg/dL (ref 0.44–1.00)
GFR calc Af Amer: >60 mL/min (ref >60)
Glucose, Bld: 98 mg/dL (ref 70–99)
Potassium: 4 mmol/L (ref 3.5–5.1)
Sodium: 136 mmol/L (ref 135–145)
Total Bilirubin: 0.5 mg/dL (ref 0.3–1.2)
Total Protein: 7.4 g/dL (ref 6.5–8.1)

## 2019-02-04 LAB — I-STAT BETA HCG BLOOD, ED (MC, WL, AP ONLY): I-stat hCG, quantitative: <5 m[IU]/mL (ref <5)

## 2019-02-04 LAB — LIPASE, BLOOD: LIPASE: 30 U/L (ref 11–51)

## 2019-02-04 MED ORDER — IOHEXOL 300 MG/ML  SOLN
100.0000 mL | Freq: Once | INTRAMUSCULAR | Status: AC | PRN
  Administered 2019-02-04: 100 mL via INTRAVENOUS

## 2019-02-04 NOTE — Discharge Instructions (Addendum)
Thank you for allowing me to care for you today. Please return to the emergency department if you have new or worsening symptoms. T

## 2019-02-04 NOTE — ED Provider Notes (Signed)
[place] [facility] EMERGENCY DEPARTMENT Provider Note   CSN: [id number] Arrival date & time: 02/04/19  1301    History   Chief Complaint Chief Complaint  Patient presents with  . Abdominal Pain    HPI Mindy [name] is a 28 y.o. female.     28 year old female presents with complaint of abdominal pain today described as sharp or stabbing in nature, primarily located in the epigastric area but also has some suprapubic discomfort.  The pain is worse with eating anything, prefers to stand, pain worse with sitting/lying down.  Reports feels like her epigastric area is distended.  Reports episode of loose stools 1 week ago however has had small firm stools since that time.  Reports one episode of vomiting 3 days ago, none since.  Patient states she is trying to get pregnant however had negative home pregnancy test recently.  Denies fevers, chills, vaginal discharge, changes in bladder habits.  No prior abdominal surgeries.  No other complaints or concerns.     Past Medical History:  Diagnosis Date  . Asthma    exercise induced- no meds  . UTI (lower urinary tract infection)     Patient Active Problem List   Diagnosis Date Noted  . Acne vulgaris 03/31/2013    Past Surgical History:  Procedure Laterality Date  . NO PAST SURGERIES       OB History    Gravida  1   Para  1   Term  1   Preterm      AB      Living  1     SAB      TAB      Ectopic      Multiple      Live Births  1            Home Medications    Prior to Admission medications   Medication Sig Start Date End Date Taking? Authorizing Provider  norgestimate-ethinyl estradiol (MONONESSA) 0.25-35 MG-MCG tablet Take 1 tablet by mouth daily. Patient not taking: Reported on 07/24/2018 01/10/18   Jonnie Kind, MD  phenazopyridine (PYRIDIUM) 95 MG tablet Take 95 mg by mouth 3 (three) times daily as needed for pain.    [provider]    Family History Family History    Problem Relation Age of Onset  . Heart murmur Son   . Cancer Maternal Aunt        lung cancer  . Heart disease Other        CAD  . Diabetes Other     Social History Social History   Tobacco Use  . Smoking status: Former Smoker    Packs/day: 0.50    Years: 3.00    Pack years: 1.50    Types: Cigarettes, E-cigarettes    Last attempt to quit: 11/27/2010    Years since quitting: 8.1  . Smokeless tobacco: Never Used  . Tobacco comment: vape  Substance Use Topics  . Alcohol use: Yes    Comment: occ  . Drug use: No     Allergies   Ibuprofen   Review of Systems Review of Systems  Constitutional: Negative for appetite change, chills and fever.  Respiratory: Negative for shortness of breath.   Cardiovascular: Negative for chest pain.  Gastrointestinal: Positive for abdominal pain and constipation. Negative for blood in stool, diarrhea, nausea and vomiting.  Genitourinary: Negative for dysuria, frequency, urgency and vaginal discharge.  Musculoskeletal: Negative for arthralgias, back pain and myalgias.  Skin: Negative for rash and wound.  Neurological: Negative for weakness.  Hematological: Does not bruise/bleed easily.  Psychiatric/Behavioral: Negative for confusion.  All other systems reviewed and are negative.    Physical Exam Updated Vital Signs BP 131/88 (BP Location: Right Arm)   Pulse 81   Temp 98.2 F (36.8 C) (Oral)   Resp 18   Ht 5\' 5"  (1.651 m)   Wt 67.1 kg   LMP 01/21/2019   SpO2 100%   BMI 24.63 kg/m   Physical Exam Vitals signs and nursing note reviewed.  Constitutional:      General: She is not in acute distress.    Appearance: She is well-developed. She is not diaphoretic.  HENT:     Head: Normocephalic and atraumatic.  Cardiovascular:     Rate and Rhythm: Normal rate and regular rhythm.     Heart sounds: Normal heart sounds.  Pulmonary:     Effort: Pulmonary effort is normal.     Breath sounds: Normal breath sounds.  Abdominal:      General: Abdomen is flat.     Palpations: Abdomen is soft.     Tenderness: There is abdominal tenderness in the left upper quadrant and left lower quadrant. There is no right CVA tenderness or left CVA tenderness. Negative signs include Murphy's sign.  Skin:    General: Skin is warm and dry.     Findings: No erythema or rash.  Neurological:     Mental Status: She is alert and oriented to person, place, and time.  Psychiatric:        Behavior: Behavior normal.      ED Treatments / Results  Labs (all labs ordered are listed, but only abnormal results are displayed) Labs Reviewed  URINALYSIS, ROUTINE W REFLEX MICROSCOPIC - Abnormal; Notable for the following components:      Result Value   Color, Urine STRAW (*)    All other components within normal limits  CBC WITH DIFFERENTIAL/PLATELET  COMPREHENSIVE METABOLIC PANEL  LIPASE, BLOOD  I-STAT BETA HCG BLOOD, ED ([facility], [facility], [facility] ONLY)    EKG None  Radiology No results found.  Procedures Procedures (including critical care time)  Medications Ordered in ED Medications - No data to display   Initial Impression / Assessment and Plan / ED Course  I have reviewed the triage vital signs and the nursing notes.  Pertinent labs & imaging results that were available during my care of the patient were reviewed by me and considered in my medical decision making (see chart for details).  Clinical Course as of Feb 04 1452  Wed Feb 04, 5655  2219 28 year old female presents with complaint of epigastric abdominal pain, worse with eating, worse with lying down.  Associate with constipation, denies nausea or vomiting, changes in her habits.  On exam has tenderness in the left upper and lower abdominal areas.  Frontal diagnosis considered but not limited to includes constipation, pancreatitis, gastric ulcer, bowel obstruction.  Review of lab work shows negative pregnancy test, CBC, lipase, CMP, urinalysis within normal limits.  Care signed out  awaiting CT abdomen pelvis with contrast.   [LM]    Clinical Course User Index [LM] Tacy Learn, PA-C      Final Clinical Impressions(s) / ED Diagnoses   Final diagnoses:  Generalized abdominal pain    ED Discharge Orders    None       Tacy Learn, PA-C 02/04/19 1453    Charlesetta Shanks, MD 02/15/19 1258

## 2019-02-04 NOTE — ED Triage Notes (Signed)
PT reports upper ABD pain that is sharp and pain is worse when sitting.

## 2019-02-04 NOTE — ED Provider Notes (Signed)
  Physical Exam  BP 131/88 (BP Location: Right Arm)   Pulse 81   Temp 98.2 F (36.8 C) (Oral)   Resp 18   Ht 5\' 5"  (1.651 m)   Wt 67.1 kg   LMP 01/21/2019   SpO2 100%   BMI 24.63 kg/m   Physical Exam  ED Course/Procedures   Clinical Course as of Feb 04 1716  Wed Feb 04, 6192  5965 28 year old female presents with complaint of epigastric abdominal pain, worse with eating, worse with lying down.  Associate with constipation, denies nausea or vomiting, changes in her habits.  On exam has tenderness in the left upper and lower abdominal areas.  Frontal diagnosis considered but not limited to includes constipation, pancreatitis, gastric ulcer, bowel obstruction.  Review of lab work shows negative pregnancy test, CBC, lipase, CMP, urinalysis within normal limits.  Care signed out awaiting CT abdomen pelvis with contrast.   [LM]    Clinical Course User Index [LM] Tacy Learn, PA-C    Procedures  MDM  Patient was handed off to me by Suella Broad PA-C due to change of shift.  Patient remained asymptomatic and stable during the time that I evaluated her.  Laboratory work is completely normal.  CT scan completely normal.  This may be GERD or constipation or gas.  This was discussed with her.  Advised her to try over-the-counter PPI if the stool softeners and Gas-X is not working.  Follow-up with primary care doctor.      Alveria Apley, PA-C 02/04/19 1719    Charlesetta Shanks, MD 02/15/19 1257

## 2019-02-04 NOTE — ED Notes (Signed)
Declined W/C at D/C and was escorted to lobby by RN. 

## 2019-07-04 IMAGING — CT CT ABD-PELV W/ CM
2 of 4 series · 16 of 46 positions shown, 18 images · IV contrast (Omni 300)
Comparison: 12/30/2016

CLINICAL DATA: Sharp upper abdominal pain

EXAM:
CT ABDOMEN AND PELVIS WITH CONTRAST
TECHNIQUE: Multidetector CT imaging of the abdomen and pelvis was performed
using the standard protocol following bolus administration of
intravenous contrast.
CONTRAST:  100mL OMNIPAQUE IOHEXOL 300 MG/ML  SOLN

[Series 3: a/p w/ 5mm · axial · 0.84mm/px · z∈[+798,+1188]mm · 13 of 86 slices shown, 15 images]
[im 4/86  soft-tissue]
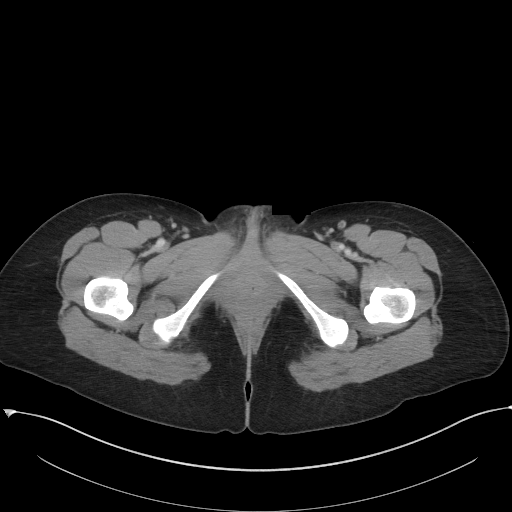
[im 4/86  bone]
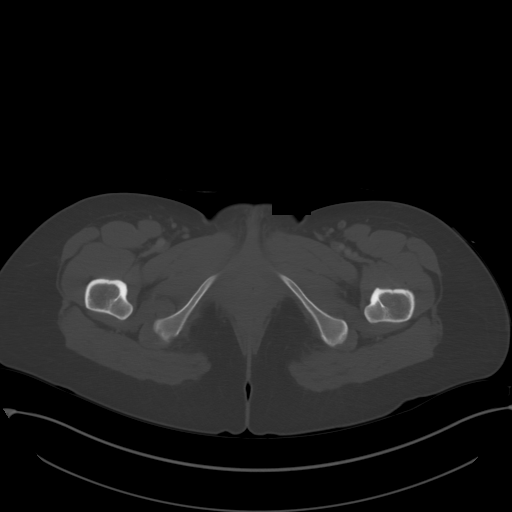
[im 11/86  soft-tissue]
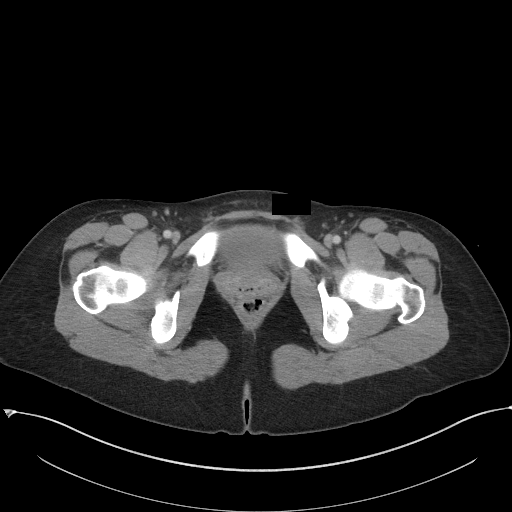
[im 18/86  soft-tissue]
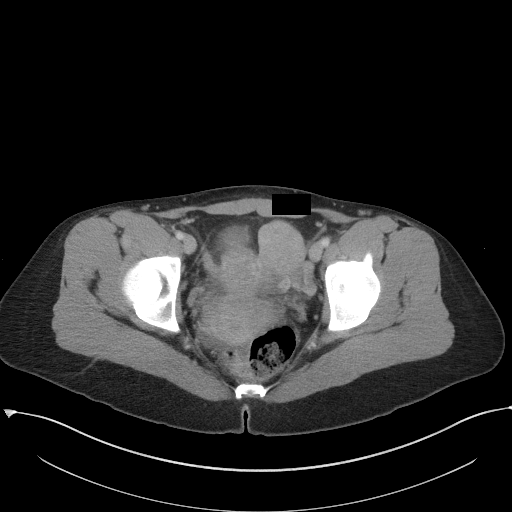
[im 25/86  soft-tissue]
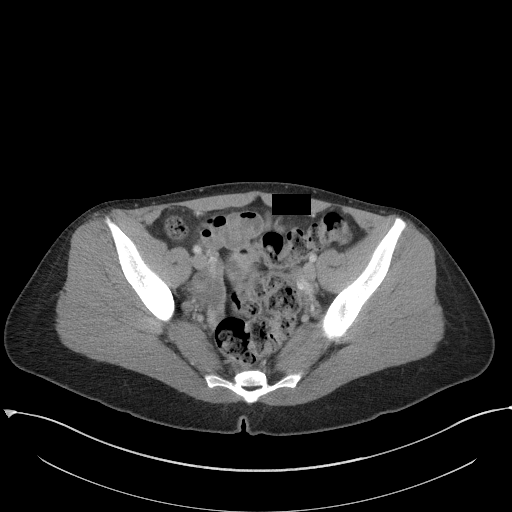
[im 29/86  soft-tissue]
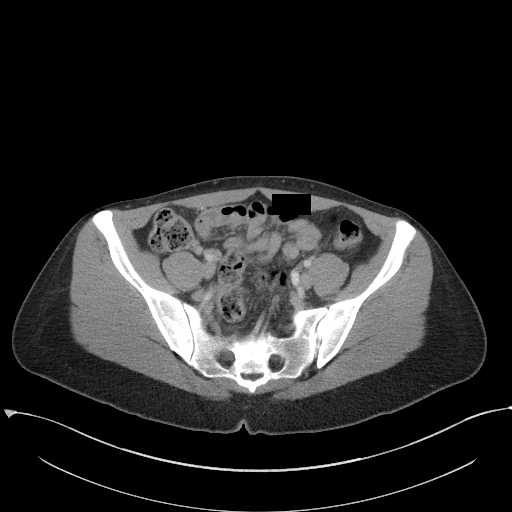
[im 36/86  soft-tissue]
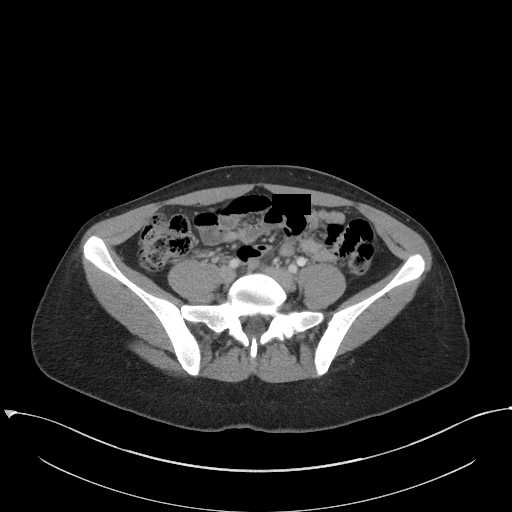
[im 43/86  soft-tissue]
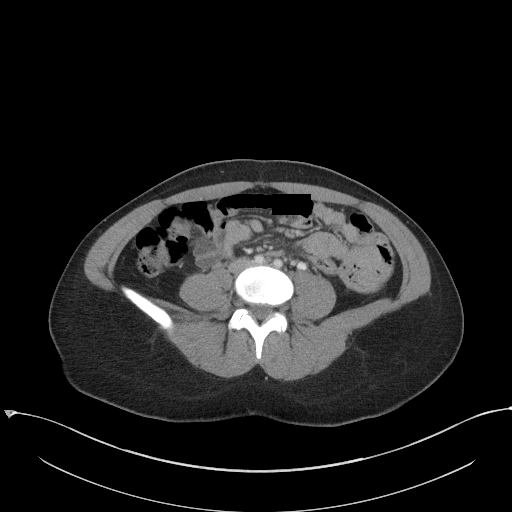
[im 50/86  soft-tissue]
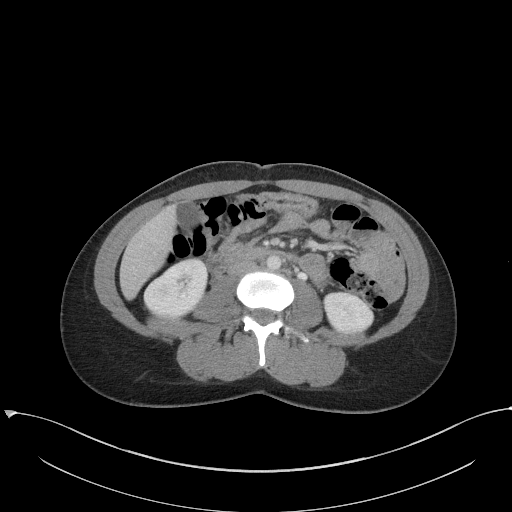
[im 57/86  soft-tissue]
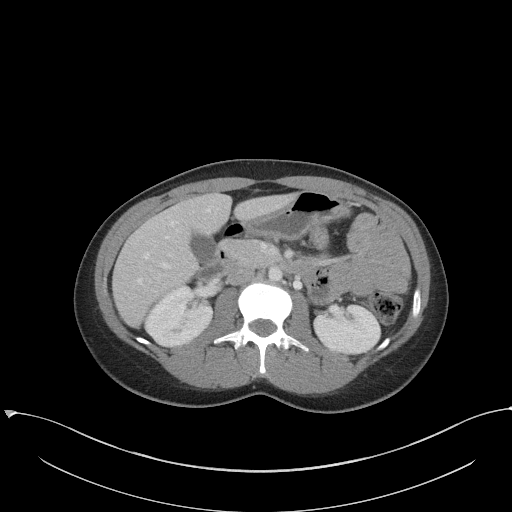
[im 57/86  bone]
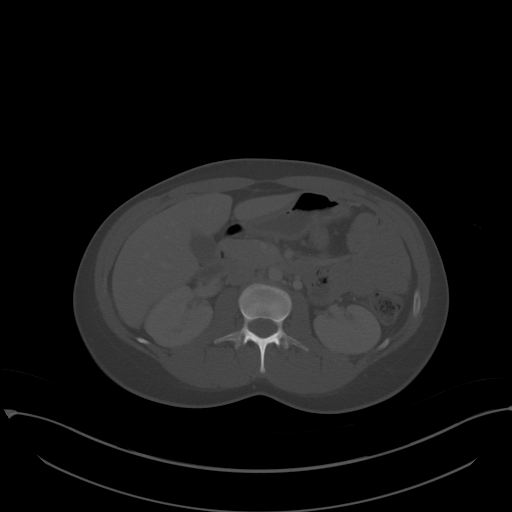
[im 61/86  soft-tissue]
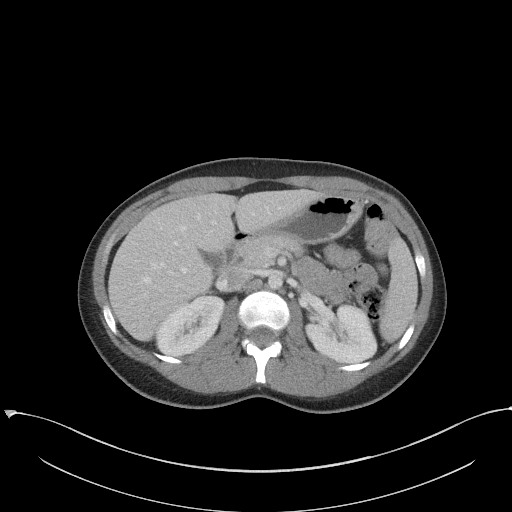
[im 68/86  soft-tissue]
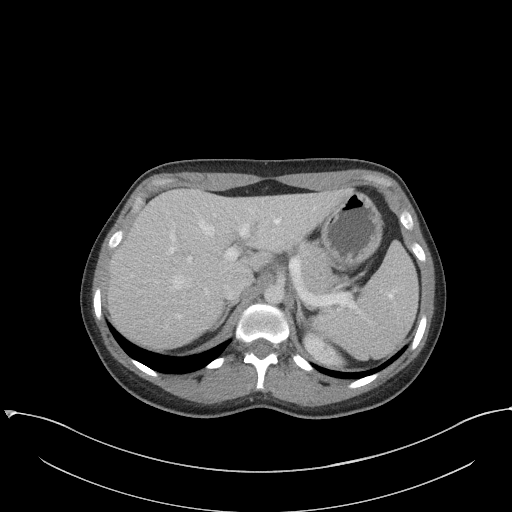
[im 75/86  soft-tissue]
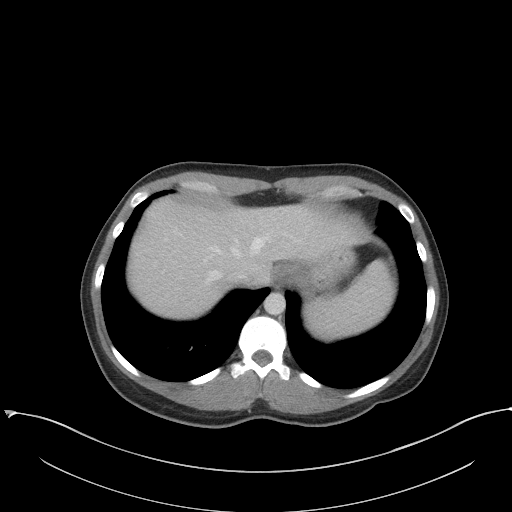
[im 82/86  soft-tissue]
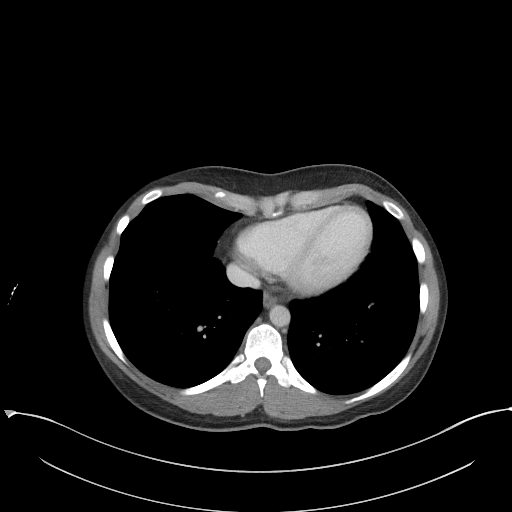

[Series 6: a/p w/ cor · coronal · 0.70mm/px · 3 of 124 slices shown]
[im 42/124  soft-tissue]
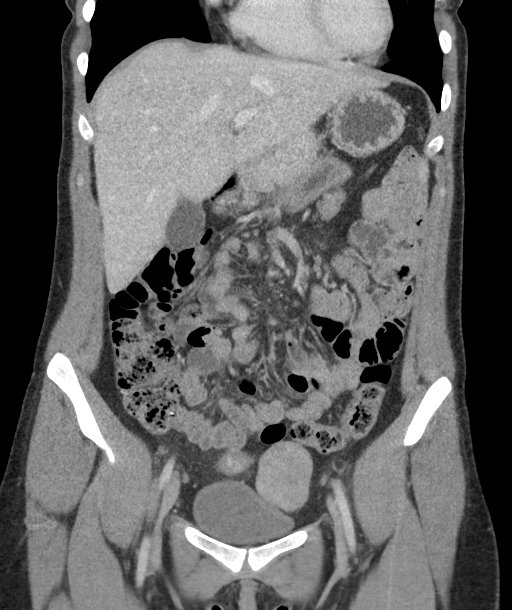
[im 55/124  soft-tissue]
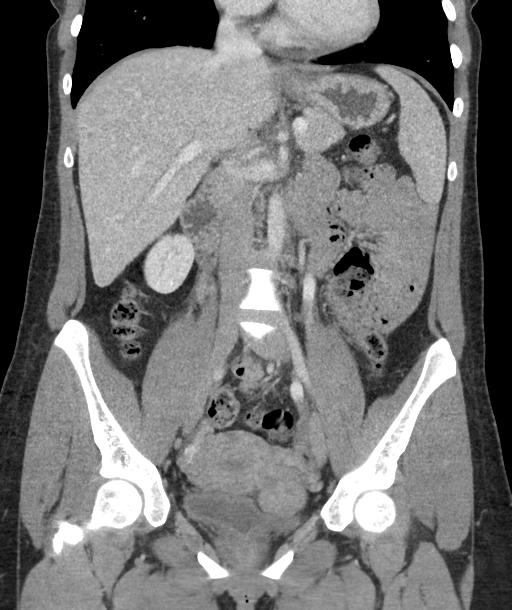
[im 69/124  soft-tissue]
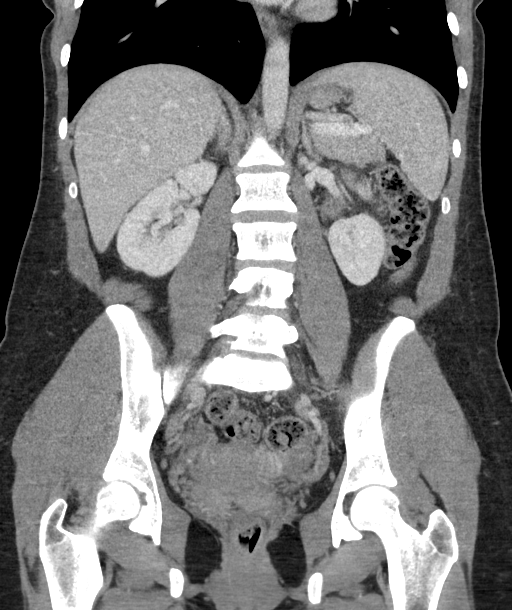

[16 of 46 positions shown; findings below may reference images not displayed]

FINDINGS: Lower chest: No acute abnormality.

Hepatobiliary: No focal liver abnormality is seen. No gallstones,
gallbladder wall thickening, or biliary dilatation.

Pancreas: Unremarkable. No pancreatic ductal dilatation or
surrounding inflammatory changes.

Spleen: Normal in size without focal abnormality.

Adrenals/Urinary Tract: Adrenal glands are unremarkable. Kidneys are
normal, without renal calculi, focal lesion, or hydronephrosis.
Bladder is unremarkable.

Stomach/Bowel: Stomach is within normal limits. Appendix appears
normal. No evidence of bowel wall thickening, distention, or
inflammatory changes.

Vascular/Lymphatic: No significant vascular findings are present. No
enlarged abdominal or pelvic lymph nodes.

Reproductive: Pedunculated fibroid of the left uterine fundus.
Prominent, left greater than right ovarian and uterine veins.

Other: No abdominal wall hernia or abnormality. Trace fluid in the
low pelvis, likely functional in the reproductive age setting.

Musculoskeletal: No acute or significant osseous findings.
IMPRESSION: 1. No acute CT findings of the abdomen or pelvis to explain upper
abdominal pain.

2.  Chronic and incidental findings as detailed above.

## 2019-10-04 ENCOUNTER — Other Ambulatory Visit: Payer: Self-pay | Admitting: Obstetrics and Gynecology

## 2019-10-08 ENCOUNTER — Telehealth: Payer: Self-pay | Admitting: *Deleted

## 2019-10-08 ENCOUNTER — Other Ambulatory Visit: Payer: Self-pay | Admitting: Advanced Practice Midwife

## 2019-10-08 MED ORDER — NORGESTIMATE-ETH ESTRADIOL 0.25-35 MG-MCG PO TABS: 1.0000 | ORAL_TABLET | Freq: Every day | ORAL | 2 refills | Status: DC

## 2019-10-08 NOTE — Telephone Encounter (Signed)
Patient called requesting a refill on her BCP.  Informed she was due for a pap/physical as she has not been seen in our office since 2017and would technically be considered a new patient.  States she currently does not have insurance.  Advised patient to go to [facility] for pap smear.  Can a few refills be sent in for her until she can get in with [facility]?

## 2019-10-08 NOTE — Telephone Encounter (Signed)
Message routed to [place].

## 2020-11-24 ENCOUNTER — Other Ambulatory Visit: Payer: Self-pay

## 2020-11-24 ENCOUNTER — Encounter (HOSPITAL_COMMUNITY): Payer: Self-pay | Admitting: *Deleted

## 2020-11-24 ENCOUNTER — Inpatient Hospital Stay (HOSPITAL_COMMUNITY)
Admission: EM | Admit: 2020-11-24 | Discharge: 2020-11-24 | Disposition: A | Discharge status: Home / Self Care | Payer: Self-pay | Attending: Family Medicine | Admitting: Family Medicine

## 2020-11-24 DIAGNOSIS — Z3201 Encounter for pregnancy test, result positive: Secondary | ICD-10-CM

## 2020-11-24 DIAGNOSIS — O360190 Maternal care for anti-D [Rh] antibodies, unspecified trimester, not applicable or unspecified: Secondary | ICD-10-CM | POA: Insufficient documentation

## 2020-11-24 DIAGNOSIS — Z3A00 Weeks of gestation of pregnancy not specified: Secondary | ICD-10-CM | POA: Insufficient documentation

## 2020-11-24 DIAGNOSIS — O4690 Antepartum hemorrhage, unspecified, unspecified trimester: Secondary | ICD-10-CM

## 2020-11-24 DIAGNOSIS — Z6790 Unspecified blood type, Rh positive: Secondary | ICD-10-CM

## 2020-11-24 DIAGNOSIS — O26859 Spotting complicating pregnancy, unspecified trimester: Secondary | ICD-10-CM | POA: Insufficient documentation

## 2020-11-24 LAB — I-STAT BETA HCG BLOOD, ED (MC, WL, AP ONLY): I-stat hCG, quantitative: 10 m[IU]/mL — ABNORMAL HIGH (ref <5)

## 2020-11-24 LAB — HCG, QUANTITATIVE, PREGNANCY: hCG, Beta Chain, Quant, S: 10 m[IU]/mL — ABNORMAL HIGH (ref <5)

## 2020-11-24 NOTE — MAU Provider Note (Signed)
S Mindy [name] is a 29 y.o. G25P1001 female who presents to MAU today with complaint of spotting and + pregnancy test. Reports positive test 3-4 days ago. Spotting started today, only when she wipes, pink/red. Has not needed a pad. Having mild cramping.    O BP 119/66 (BP Location: Left Arm)   Pulse 71   Temp 98 F (36.7 C)   Resp 12   Ht 5\' 5"  (1.651 m)   Wt 70.4 kg   SpO2 97%   BMI 25.83 kg/m  Physical Exam Vitals and nursing note reviewed.  Constitutional:      Appearance: Normal appearance.  HENT:     Head: Normocephalic and atraumatic.  Pulmonary:     Effort: Pulmonary effort is normal. No respiratory distress.  Musculoskeletal:     Cervical back: Normal range of motion.  Neurological:     General: No focal deficit present.     Mental Status: She is alert and oriented to person, place, and time.  Psychiatric:        Mood and Affect: Mood normal.        Behavior: Behavior normal.    MDM: istat in ED was 10, quant hcg ordered. Qhcg likely on way down since had + test at home several days ago. Will call pt with results.  1650: Pt notified of results, plan for f/u qhcg in 12/26, consult with Dr. Dione Plover, agrees.  A 1. Positive pregnancy test   2. Vaginal bleeding in pregnancy   3. Blood type, Rh positive    Results for orders placed or performed during the hospital encounter of 11/24/20 (from the past 24 hour(s))  I-Stat Beta hCG blood, ED ([facility], [facility], [facility] only)     Status: Abnormal   Collection Time: 11/24/20  2:06 PM  Result Value Ref Range   I-stat hCG, quantitative 10.0 (H) <5 mIU/mL   Comment 3          hCG, quantitative, pregnancy     Status: Abnormal   Collection Time: 11/24/20  3:26 PM  Result Value Ref Range   hCG, Beta Chain, Quant, S 10 (H) <5 mIU/mL   P Discharge from MAU in stable condition Warning signs for worsening condition that would warrant emergency follow-up discussed Patient may return to MAU as needed for pregnancy related  complaints  Julianne Handler, CNM 11/24/2020 4:51 PM

## 2020-11-24 NOTE — ED Triage Notes (Signed)
Emergency Medicine Provider OB Triage Evaluation Note  Mindy [name] is a 29 y.o. female, G1P1001, at Unknown gestation who presents to the emergency department with complaints of vaginal cramping and vaginal bleeding x1 day..  Patient endorses that she has been trying to get pregnant for the last couple months, she  stopped using her new  ring.  Patient states her last menstrual cycle was 2 months ago.  She states they are generally regular.   she decided to use a home pregnancy test and had 4 positives about 2 days ago.  She denies  recent abdominal trauma  vaginal trauma, denies urinary symptoms, vaginal discharge, vaginal pain.  She currently does not have an OB/GYN established at this time.  She denies headaches, fevers, chills, shortness breath, chest pain, abdominal pain, nausea, vomiting, diarrhea, pedal edema. Review of  Systems  General: negative HEENT:negative Resp:negative  Cardiac:negative Abd: Admits to pelvic cramping, vaginal bleeding, denies all other abdominal and GU complaints MSK: negative Physical Exam  BP (!) 131/101   Pulse 96   Temp 98.6 F (37 C) (Oral)   Resp 20   Ht 5\' 5"  (1.651 m)   Wt 70.4 kg   SpO2 100%   BMI 25.83 kg/m  General: Awake, no distress  HEENT: Atraumatic  Resp: Normal effort  Cardiac: Normal rate Abd: Nondistended, nontender  MSK: Moves all extremities without difficulty Neuro: Speech clear  Medical Decision Making  Pt evaluated for pregnancy concern and is stable for transfer to MAU. Pt is in agreement with plan for transfer.  3:20 PM Discussed with MAU APP, Melody, who accepts patient in transfer.  Clinical Impression  No diagnosis found.     Marcello Fennel, PA-C 11/24/20 1520

## 2020-11-24 NOTE — ED Triage Notes (Signed)
Pt had positive pregnancy test at home, unknown LMP, sometime in October. Reports slight vaginal bleeding this morning and pelvic cramping.

## 2020-11-24 NOTE — MAU Note (Signed)
Pt reports 3-4 days ago she took several home pregnancy and all were positive.   Pt reports vaginal bleeding when wiping that started this morning.   Pt reports abdominal cramping that started today.

## 2020-11-29 ENCOUNTER — Telehealth: Payer: Self-pay | Admitting: Medical

## 2020-11-29 NOTE — Telephone Encounter (Signed)
Attempted to contact patient, Mindy [name] on cell phone/home phone. No answer and no VM.   Vonzella Nipple, PA-C 11/29/2020 10:19 AM

## 2022-06-24 ENCOUNTER — Emergency Department (HOSPITAL_COMMUNITY): Payer: Self-pay

## 2022-06-24 ENCOUNTER — Encounter (HOSPITAL_COMMUNITY): Payer: Self-pay

## 2022-06-24 ENCOUNTER — Emergency Department (HOSPITAL_COMMUNITY)
Admission: EM | Admit: 2022-06-24 | Discharge: 2022-06-24 | Disposition: A | Discharge status: Home / Self Care | Payer: Self-pay | Attending: Emergency Medicine | Admitting: Emergency Medicine

## 2022-06-24 DIAGNOSIS — M25531 Pain in right wrist: Secondary | ICD-10-CM | POA: Insufficient documentation

## 2022-06-24 DIAGNOSIS — S40212A Abrasion of left shoulder, initial encounter: Secondary | ICD-10-CM | POA: Insufficient documentation

## 2022-06-24 DIAGNOSIS — H538 Other visual disturbances: Secondary | ICD-10-CM | POA: Insufficient documentation

## 2022-06-24 DIAGNOSIS — M25532 Pain in left wrist: Secondary | ICD-10-CM | POA: Insufficient documentation
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
## 2022-06-24 NOTE — Discharge Instructions (Signed)
Head CT scan and x-ray of wrists, shoulder, right foot were all negative.  Appropriate to discharge home recommend Tylenol to treat pain.  If new headache, blurry vision, altered speech, or decreased sensation or range of motion of extremities please return to the emergency department.
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
## 2023-11-04 DIAGNOSIS — B349 Viral infection, unspecified: Secondary | ICD-10-CM | POA: Diagnosis not present

## 2023-12-31 DIAGNOSIS — R8290 Unspecified abnormal findings in urine: Secondary | ICD-10-CM | POA: Diagnosis not present

## 2023-12-31 DIAGNOSIS — J101 Influenza due to other identified influenza virus with other respiratory manifestations: Secondary | ICD-10-CM | POA: Diagnosis not present

## 2023-12-31 DIAGNOSIS — Z20822 Contact with and (suspected) exposure to covid-19: Secondary | ICD-10-CM | POA: Diagnosis not present

## 2023-12-31 DIAGNOSIS — R112 Nausea with vomiting, unspecified: Secondary | ICD-10-CM | POA: Diagnosis not present

## 2023-12-31 DIAGNOSIS — E860 Dehydration: Secondary | ICD-10-CM | POA: Diagnosis not present

## 2024-01-03 DIAGNOSIS — Z711 Person with feared health complaint in whom no diagnosis is made: Secondary | ICD-10-CM | POA: Diagnosis not present
